# Patient Record
Sex: Female | Born: 1950 | Race: White | Hispanic: No | Smoking: Current every day smoker
Health system: Southern US, Community
[De-identification: ages and names within clinical notes are randomized; demographics above are authoritative.]

## PROBLEM LIST (undated history)

## (undated) DIAGNOSIS — J45909 Unspecified asthma, uncomplicated: Secondary | ICD-10-CM

## (undated) DIAGNOSIS — I10 Essential (primary) hypertension: Secondary | ICD-10-CM

## (undated) DIAGNOSIS — J449 Chronic obstructive pulmonary disease, unspecified: Secondary | ICD-10-CM

## (undated) DIAGNOSIS — F17200 Nicotine dependence, unspecified, uncomplicated: Secondary | ICD-10-CM

## (undated) DIAGNOSIS — IMO0001 Reserved for inherently not codable concepts without codable children: Secondary | ICD-10-CM

## (undated) DIAGNOSIS — F419 Anxiety disorder, unspecified: Secondary | ICD-10-CM

## (undated) DIAGNOSIS — D649 Anemia, unspecified: Secondary | ICD-10-CM

## (undated) DIAGNOSIS — E66813 Obesity, class 3: Secondary | ICD-10-CM

## (undated) DIAGNOSIS — R079 Chest pain, unspecified: Secondary | ICD-10-CM

## (undated) DIAGNOSIS — G4730 Sleep apnea, unspecified: Secondary | ICD-10-CM

## (undated) DIAGNOSIS — Z87442 Personal history of urinary calculi: Secondary | ICD-10-CM

## (undated) DIAGNOSIS — R7303 Prediabetes: Secondary | ICD-10-CM

## (undated) DIAGNOSIS — I219 Acute myocardial infarction, unspecified: Secondary | ICD-10-CM

## (undated) DIAGNOSIS — F32A Depression, unspecified: Secondary | ICD-10-CM

## (undated) DIAGNOSIS — F329 Major depressive disorder, single episode, unspecified: Secondary | ICD-10-CM

## (undated) DIAGNOSIS — M1990 Unspecified osteoarthritis, unspecified site: Secondary | ICD-10-CM

## (undated) HISTORY — PX: TONSILLECTOMY: SUR1361

## (undated) HISTORY — PX: CARPAL TUNNEL RELEASE: SHX101

## (undated) HISTORY — PX: BREAST LUMPECTOMY: SHX2

## (undated) HISTORY — PX: CERVICAL DISC SURGERY: SHX588

## (undated) HISTORY — PX: ABDOMINAL HYSTERECTOMY: SHX81

## (undated) HISTORY — PX: HEMORROIDECTOMY: SUR656

---

## 1999-09-28 ENCOUNTER — Emergency Department (HOSPITAL_COMMUNITY): Admission: EM | Admit: 1999-09-28 | Discharge: 1999-09-28 | Payer: Self-pay | Admitting: *Deleted

## 1999-09-28 ENCOUNTER — Encounter: Payer: Self-pay | Admitting: Emergency Medicine

## 2000-03-18 ENCOUNTER — Ambulatory Visit (HOSPITAL_COMMUNITY): Admission: RE | Admit: 2000-03-18 | Discharge: 2000-03-18 | Payer: Self-pay | Admitting: Family Medicine

## 2002-05-15 ENCOUNTER — Encounter: Payer: Self-pay | Admitting: Internal Medicine

## 2002-05-15 ENCOUNTER — Inpatient Hospital Stay (HOSPITAL_COMMUNITY): Admission: AD | Admit: 2002-05-15 | Discharge: 2002-05-16 | Payer: Self-pay | Admitting: Internal Medicine

## 2002-07-13 ENCOUNTER — Ambulatory Visit (HOSPITAL_COMMUNITY): Admission: RE | Admit: 2002-07-13 | Discharge: 2002-07-13 | Payer: Self-pay | Admitting: Family Medicine

## 2002-07-28 ENCOUNTER — Encounter: Payer: Self-pay | Admitting: Neurology

## 2002-07-28 ENCOUNTER — Ambulatory Visit (HOSPITAL_COMMUNITY): Admission: RE | Admit: 2002-07-28 | Discharge: 2002-07-28 | Payer: Self-pay | Admitting: Neurology

## 2005-04-01 ENCOUNTER — Ambulatory Visit (HOSPITAL_COMMUNITY): Admission: AD | Admit: 2005-04-01 | Discharge: 2005-04-02 | Payer: Self-pay | Admitting: Neurosurgery

## 2005-12-07 ENCOUNTER — Encounter (INDEPENDENT_AMBULATORY_CARE_PROVIDER_SITE_OTHER): Payer: Self-pay | Admitting: Specialist

## 2005-12-07 ENCOUNTER — Encounter: Admission: RE | Admit: 2005-12-07 | Discharge: 2005-12-07 | Payer: Self-pay | Admitting: General Surgery

## 2006-01-04 ENCOUNTER — Encounter: Admission: RE | Admit: 2006-01-04 | Discharge: 2006-01-04 | Payer: Self-pay | Admitting: General Surgery

## 2006-01-06 ENCOUNTER — Ambulatory Visit (HOSPITAL_BASED_OUTPATIENT_CLINIC_OR_DEPARTMENT_OTHER): Admission: RE | Admit: 2006-01-06 | Discharge: 2006-01-06 | Payer: Self-pay | Admitting: General Surgery

## 2006-01-06 ENCOUNTER — Encounter: Admission: RE | Admit: 2006-01-06 | Discharge: 2006-01-06 | Payer: Self-pay | Admitting: General Surgery

## 2006-01-06 ENCOUNTER — Encounter (INDEPENDENT_AMBULATORY_CARE_PROVIDER_SITE_OTHER): Payer: Self-pay | Admitting: *Deleted

## 2009-06-19 ENCOUNTER — Ambulatory Visit (HOSPITAL_BASED_OUTPATIENT_CLINIC_OR_DEPARTMENT_OTHER): Admission: RE | Admit: 2009-06-19 | Discharge: 2009-06-19 | Payer: Self-pay | Admitting: Orthopedic Surgery

## 2009-10-12 ENCOUNTER — Inpatient Hospital Stay: Payer: Self-pay | Admitting: Internal Medicine

## 2009-10-12 ENCOUNTER — Ambulatory Visit: Payer: Self-pay | Admitting: Oncology

## 2009-11-04 ENCOUNTER — Ambulatory Visit: Payer: Self-pay | Admitting: Oncology

## 2009-11-12 ENCOUNTER — Ambulatory Visit: Payer: Self-pay | Admitting: Oncology

## 2010-08-29 LAB — BASIC METABOLIC PANEL
BUN: 14 mg/dL (ref 6–23)
Creatinine, Ser: 0.86 mg/dL (ref 0.4–1.2)
GFR calc non Af Amer: >60 mL/min (ref >60)
Potassium: 4.4 mEq/L (ref 3.5–5.1)

## 2010-08-29 LAB — POCT HEMOGLOBIN-HEMACUE: Hemoglobin: 15.5 g/dL — ABNORMAL HIGH (ref 12.0–15.0)

## 2010-10-30 NOTE — Discharge Summary (Signed)
NAME:  Jenna [name], Jenna [name]                          ACCOUNT NO.:  [id number]   MEDICAL RECORD NO.:  [id number]                   PATIENT TYPE:  INP   LOCATION:  5502                                 FACILITY:  [facility]   PHYSICIAN:  Sherin Quarry, MD                   DATE OF BIRTH:  [birth year]   DATE OF ADMISSION:  05/15/2002  DATE OF DISCHARGE:  05/16/2002                                 DISCHARGE SUMMARY   The patient is a 60 year old lady who reports a three-week history of  numbness of both hands and both feet.  There was no associated weakness or  pain.  On Sunday, she noted the onset of a cold feeling in the left arm  which seemed to run from the shoulder to the elbow followed by numbness in  left arm and left leg.  There was no associated visual blurring, motor  weakness, or dysphagia.  The episode seemed to last an hour and then  resolved.  On Monday, she noted another episode and then two more episodes  later in the day.  Each of these lasted about 15 or 20 minutes.  She was,  therefore, advised to come to the hospital for further evaluation.  Her past  history is remarkable for hypertension, hyperlipidemia, and COPD with a 50-  pack-year smoking history.   PHYSICAL EXAMINATION:  HEENT:  At the time of admission, examination was  within normal limits.  NECK:  Carotids were 2+.  CHEST:  Clear.  BACK:  No CVA or point tenderness.  CARDIOVASCULAR:  Normal S1 and S2 without murmurs, rubs, or gallops.  ABDOMEN:  Benign.  NEUROLOGIC:  Cranial nerves, motor, sensory, and cerebellar testing were  normal.  EXTREMITIES:  No evidence of cyanosis or edema.   INITIAL IMPRESSION:  Possible transient ischemic attack.   LABORATORY DATA:  Subsequently the patient underwent laboratory testing  which included a CBC, TSH, lipid profile, and CMP, all of which were normal.   Carotid studies were done.  These showed no internal carotid stenosis.  Vertebral artery flow was antegrade.   MRI scan  was done.  I reviewed the results of this test with the  radiologist.  I do not have the final report.  This study showed no stroke,  no brain lesions, and no apparent circulatory impairment.  I was advised by  utilization review that the patient did not meet criteria for acute  admission. Therefore, we decided to arrange further evaluation as an  outpatient.   On December 3, the patient was discharged.   DISCHARGE DIAGNOSES:  1. Left upper extremity numbness of uncertain etiology.  2. Hypertension.  3. Hyperlipidemia.  4. Chronic obstructive pulmonary disease.  5. Degenerative joint disease.   DISCHARGE MEDICATIONS:  The patient was advised to continue her usual  medicines and also was advised to take aspirin 325 mg daily.   SPECIAL INSTRUCTIONS:  She was very strongly on several occasions advised to  discontinue cigarette smoking.  It should be noted that during her  hospitalization, she became very upset about not being allowed to smoke.  I  asked the floor to schedule a return appointment with Dr. Adonis Housekeeper at  [facility] and also a neurology appointment with [facility] as an outpatient.   CONDITION ON DISCHARGE:  Good.                                               Sherin Quarry, MD    SY/MEDQ  D:  05/16/2002  T:  05/16/2002  Job:  [id number]   cc:   Duwayne Heck L. Mahaffey, M.D.  [address]  [place]  [place] [address]  Fax: [phone]

## 2010-10-30 NOTE — Op Note (Signed)
Jenna [name], Jenna [name]                ACCOUNT NO.:  [id number]   MEDICAL RECORD NO.:  [id number]          PATIENT TYPE:  INP   LOCATION:  NA                           FACILITY:  [facility]   PHYSICIAN:  Reinaldo Meeker, M.D. DATE OF BIRTH:  [birth year]   DATE OF PROCEDURE:  04/01/2005  DATE OF DISCHARGE:                                 OPERATIVE REPORT   PREOPERATIVE DIAGNOSIS:  Herniated disk, C5-6 left.   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   OPERATION PERFORMED:  C5-6 anterior cervical diskectomy with bone bank  fusion followed by Zephir anterior cervical plating.   SURGEON:  Reinaldo Meeker, M.D.   ASSISTANT:  Tia Alert, MD   ANESTHESIA:  General.   DESCRIPTION OF PROCEDURE:  After being placed in supine position in five  pounds halter traction, the patient's neck was prepped and draped in the  usual sterile fashion.  Localizing x-ray was taken prior to incision to  identify the appropriate level. A transverse incision was made in the right  anterior neck starting at the midline, heading towards the medial aspect of  the sternocleidomastoid muscle.  The platysma muscle was then incised  transversely.  The natural fascial planes between the strap muscles medially  and sternocleidomastoid muscle laterally was identified and followed down to  the anterior aspect of the cervical spine.  The longus colli muscles were  identified and split in the midline, stripped away bilaterally with Chief Operating Officer.  A second x-ray showed approach to the C5-6  level though this was rather difficult to interpret due to the patient's  large body habitus, but numerous attempts did appear to show consistently  that we were at the C5-6 level.  The annulus at the disk at C5-6 was  incised.  Using pituitary rongeurs and curettes, approximately 90% of the  disk material was removed.  A high speed drill was used to widen the  interspace.  The microscope was draped and  brought into the field and used  for the remainder of the case.  Using microdissection technique, the  remainder of the disk material down to the posterior longitudinal ligament  was removed.  The ligament was incised transversely and the cut ends removed  with a Kerrison punch.  Foraminal decompression was carried out bilaterally,  particularly out the left, symptomatic side.  At this time, inspection was  carried out in all directions for any evidence of residual compression and  none could be identified.  Large amounts of irrigation were carried out.  Measurements were taken and a 6 mm bone bank plug was reconstituted.  After  irrigating once more and confirming hemostasis, a plug was impacted without  difficulty.  Fluoroscopy showed the plug to be in good position.  Appropriate length Zephyr anterior cervical plate was then chosen.  Dry  holes were placed followed by placement of 13 mm screws times four.  Locking  mechanisms were then secured and final fluoroscopy attempted to show the  plate and it appeared to be in good position.  At this time large  amounts of  irrigation were carried out and any bleeding controlled with bipolar  coagulation.  The wound was then closed using interrupted Vicryl in the  platysma muscle, inverted 5-0 PDS in the subcuticular layer and Steri-Strips  on the skin.  A sterile dressing and soft collar were applied and the  patient was extubated and taken to the recovery room in stable condition.           ______________________________  Reinaldo Meeker, M.D.     ROK/MEDQ  D:  04/01/2005  T:  04/02/2005  Job:  [id number]

## 2010-10-30 NOTE — Op Note (Signed)
NAMEALDEN, Jenna [name]                ACCOUNT NO.:  [id number]   MEDICAL RECORD NO.:  [id number]          PATIENT TYPE:  AMB   LOCATION:  DSC                          FACILITY:  [facility]   PHYSICIAN:  Leonie Man, M.D.   DATE OF BIRTH:  Mar 13, 1951   DATE OF PROCEDURE:  01/06/2006  DATE OF DISCHARGE:                                 OPERATIVE REPORT   PREOPERATIVE DIAGNOSIS:  Left breast lesion, rule out carcinoma.   POSTOPERATIVE DIAGNOSIS:  Left breast lesion, rule out carcinoma, pathology  pending.   PROCEDURE:  Lumpectomy, left breast.   SURGEON:  Leonie Man, M.D.   ASSISTANT:  OR nurse.   ANESTHESIA:  Is general.   NOTE:  Jenna [name] is a 59 year old female presenting with an abnormal  mammogram read as a BI-RADS IV and showing an area of calcifications in the  left breast.  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  Because of persistence of the calcifications, I  told total the patient that it would probably be best for her to have this  lesion removed surgically and she accepts this and comes to the operating  room now for a lumpectomy of the left breast.   PROCEDURE:  Following the induction of satisfactory general anesthesia, the  patient is positioned supinely and the left breast was prepped and draped to  be included in the sterile operative field.  Prior to coming to the  operating room, the patient underwent needle localization of the lesion,  which is located at the 12 o'clock axis of the left breast.  Following  induction of satisfactory general anesthesia, the left breast was prepped  and draped to be included in the sterile operative field.  I made an  elliptical incision around the localizing needle, deepening this through  skin and subcutaneous tissues, carrying the dissection widely around in the  localizing needle and carrying it down to the  pectoralis major muscle.  The  breast tissue was dissected free from off of the pectoralis major muscle and  removed and forwarded for pathologic evaluation after a long suture was  placed at the 12 o'clock axis and a double suture placed at the 3 o'clock  axis.  Hemostasis was obtained with electrocautery.  Sponge and instrument  counts were verified.  Subcutaneous tissues were then closed with  interrupted 2-0 Vicryl sutures and the skin was closed with a running five 5-  0 Monocryl suture and then reinforced with Steri-Strips.  I infiltrated the  breast and surrounding tissues with 0.5% Marcaine with epinephrine.  Sterile  compressive dressings were applied, the anesthetic reversed and the patient  removed from the operating room to the recovery room in stable condition.  She tolerated the procedure well.     Leonie Man, M.D.  Electronically Signed    PB/MEDQ  D:  01/06/2006  T:  01/06/2006  Job:  [id number]

## 2017-07-15 HISTORY — PX: COLONOSCOPY: SHX174

## 2017-09-12 DIAGNOSIS — R079 Chest pain, unspecified: Secondary | ICD-10-CM

## 2017-09-12 HISTORY — DX: Chest pain, unspecified: R07.9

## 2017-09-26 ENCOUNTER — Encounter (HOSPITAL_COMMUNITY): Payer: Self-pay | Admitting: Internal Medicine

## 2017-09-26 ENCOUNTER — Other Ambulatory Visit: Payer: Self-pay

## 2017-09-26 ENCOUNTER — Emergency Department (HOSPITAL_COMMUNITY): Payer: Medicare Other

## 2017-09-26 ENCOUNTER — Inpatient Hospital Stay (HOSPITAL_COMMUNITY)
Admission: EM | Admit: 2017-09-26 | Discharge: 2017-09-28 | DRG: 281 | Disposition: A | Discharge status: Home / Self Care | Payer: Medicare Other | Attending: Internal Medicine | Admitting: Internal Medicine

## 2017-09-26 DIAGNOSIS — I200 Unstable angina: Secondary | ICD-10-CM

## 2017-09-26 DIAGNOSIS — I25110 Atherosclerotic heart disease of native coronary artery with unstable angina pectoris: Secondary | ICD-10-CM | POA: Diagnosis present

## 2017-09-26 DIAGNOSIS — E785 Hyperlipidemia, unspecified: Secondary | ICD-10-CM | POA: Diagnosis present

## 2017-09-26 DIAGNOSIS — I10 Essential (primary) hypertension: Secondary | ICD-10-CM | POA: Diagnosis present

## 2017-09-26 DIAGNOSIS — R079 Chest pain, unspecified: Secondary | ICD-10-CM | POA: Diagnosis not present

## 2017-09-26 DIAGNOSIS — Z23 Encounter for immunization: Secondary | ICD-10-CM | POA: Diagnosis present

## 2017-09-26 DIAGNOSIS — I214 Non-ST elevation (NSTEMI) myocardial infarction: Principal | ICD-10-CM | POA: Diagnosis present

## 2017-09-26 DIAGNOSIS — Z6841 Body Mass Index (BMI) 40.0 and over, adult: Secondary | ICD-10-CM

## 2017-09-26 DIAGNOSIS — F17210 Nicotine dependence, cigarettes, uncomplicated: Secondary | ICD-10-CM | POA: Diagnosis present

## 2017-09-26 DIAGNOSIS — N3281 Overactive bladder: Secondary | ICD-10-CM | POA: Diagnosis present

## 2017-09-26 DIAGNOSIS — E782 Mixed hyperlipidemia: Secondary | ICD-10-CM | POA: Diagnosis not present

## 2017-09-26 DIAGNOSIS — Z8249 Family history of ischemic heart disease and other diseases of the circulatory system: Secondary | ICD-10-CM

## 2017-09-26 DIAGNOSIS — F17200 Nicotine dependence, unspecified, uncomplicated: Secondary | ICD-10-CM | POA: Diagnosis not present

## 2017-09-26 HISTORY — DX: Obesity, class 3: E66.813

## 2017-09-26 HISTORY — DX: Anxiety disorder, unspecified: F41.9

## 2017-09-26 HISTORY — DX: Major depressive disorder, single episode, unspecified: F32.9

## 2017-09-26 HISTORY — DX: Unspecified osteoarthritis, unspecified site: M19.90

## 2017-09-26 HISTORY — DX: Reserved for inherently not codable concepts without codable children: IMO0001

## 2017-09-26 HISTORY — DX: Depression, unspecified: F32.A

## 2017-09-26 HISTORY — DX: Morbid (severe) obesity due to excess calories: E66.01

## 2017-09-26 HISTORY — DX: Essential (primary) hypertension: I10

## 2017-09-26 HISTORY — DX: Nicotine dependence, unspecified, uncomplicated: F17.200

## 2017-09-26 HISTORY — DX: Chest pain, unspecified: R07.9

## 2017-09-26 HISTORY — DX: Chronic obstructive pulmonary disease, unspecified: J44.9

## 2017-09-26 HISTORY — DX: Sleep apnea, unspecified: G47.30

## 2017-09-26 LAB — CBC WITH DIFFERENTIAL/PLATELET
Basophils Absolute: 0 10*3/uL (ref 0.0–0.1)
Basophils Relative: 0 %
Eosinophils Absolute: 0.2 10*3/uL (ref 0.0–0.7)
Eosinophils Relative: 2 %
HCT: 44.9 % (ref 36.0–46.0)
HEMOGLOBIN: 15.9 g/dL — AB (ref 12.0–15.0)
LYMPHS ABS: 3.1 10*3/uL (ref 0.7–4.0)
LYMPHS PCT: 28 %
MCH: 31.5 pg (ref 26.0–34.0)
MCHC: 35.4 g/dL (ref 30.0–36.0)
MCV: 88.9 fL (ref 78.0–100.0)
MONOS PCT: 9 %
Monocytes Absolute: 1 10*3/uL (ref 0.1–1.0)
NEUTROS PCT: 61 %
Neutro Abs: 6.9 10*3/uL (ref 1.7–7.7)
Platelets: 277 10*3/uL (ref 150–400)
RBC: 5.05 MIL/uL (ref 3.87–5.11)
RDW: 12.9 % (ref 11.5–15.5)
WBC: 11.3 10*3/uL — AB (ref 4.0–10.5)

## 2017-09-26 LAB — BASIC METABOLIC PANEL
Anion gap: 12 (ref 5–15)
BUN: 16 mg/dL (ref 6–20)
CHLORIDE: 103 mmol/L (ref 101–111)
CO2: 20 mmol/L — AB (ref 22–32)
Calcium: 8.8 mg/dL — ABNORMAL LOW (ref 8.9–10.3)
Creatinine, Ser: 0.97 mg/dL (ref 0.44–1.00)
GFR calc Af Amer: >60 mL/min (ref >60)
GFR calc non Af Amer: 60 mL/min — ABNORMAL LOW (ref >60)
Glucose, Bld: 99 mg/dL (ref 65–99)
POTASSIUM: 3.9 mmol/L (ref 3.5–5.1)
SODIUM: 135 mmol/L (ref 135–145)

## 2017-09-26 LAB — I-STAT TROPONIN, ED: Troponin i, poc: 0.13 ng/mL (ref 0.00–0.08)

## 2017-09-26 LAB — TROPONIN I: TROPONIN I: 2.54 ng/mL — AB (ref <0.03)

## 2017-09-26 MED ORDER — NITROGLYCERIN 0.4 MG SL SUBL: 0.4000 mg | SUBLINGUAL_TABLET | SUBLINGUAL | Status: DC | PRN

## 2017-09-26 MED ORDER — ASPIRIN 81 MG PO CHEW: 81.0000 mg | CHEWABLE_TABLET | Freq: Once | ORAL | Status: DC

## 2017-09-26 MED ORDER — ASPIRIN 81 MG PO CHEW
324.0000 mg | CHEWABLE_TABLET | Freq: Once | ORAL | Status: AC
  Administered 2017-09-26: 324 mg via ORAL
  Filled 2017-09-26: qty 4

## 2017-09-26 MED ORDER — ALBUTEROL SULFATE HFA 108 (90 BASE) MCG/ACT IN AERS: 1.0000 | INHALATION_SPRAY | Freq: Four times a day (QID) | RESPIRATORY_TRACT | Status: DC | PRN

## 2017-09-26 MED ORDER — ATORVASTATIN CALCIUM 40 MG PO TABS
40.0000 mg | ORAL_TABLET | Freq: Every day | ORAL | Status: DC
  Filled 2017-09-26 (×2): qty 1

## 2017-09-26 MED ORDER — ACETAMINOPHEN 325 MG PO TABS: 650.0000 mg | ORAL_TABLET | ORAL | Status: DC | PRN

## 2017-09-26 MED ORDER — METOPROLOL TARTRATE 25 MG PO TABS
50.0000 mg | ORAL_TABLET | Freq: Two times a day (BID) | ORAL | Status: DC
  Administered 2017-09-26 – 2017-09-28 (×4): 50 mg via ORAL
  Filled 2017-09-26 (×4): qty 2

## 2017-09-26 MED ORDER — MORPHINE SULFATE (PF) 4 MG/ML IV SOLN: 4.0000 mg | Freq: Once | INTRAVENOUS | Status: DC

## 2017-09-26 MED ORDER — ASPIRIN EC 81 MG PO TBEC
81.0000 mg | DELAYED_RELEASE_TABLET | Freq: Every day | ORAL | Status: DC
  Administered 2017-09-27: 81 mg via ORAL
  Filled 2017-09-26: qty 1

## 2017-09-26 MED ORDER — PAROXETINE HCL 20 MG PO TABS
40.0000 mg | ORAL_TABLET | Freq: Every day | ORAL | Status: DC
  Administered 2017-09-27 – 2017-09-28 (×2): 40 mg via ORAL
  Filled 2017-09-26 (×3): qty 2

## 2017-09-26 MED ORDER — NICOTINE 21 MG/24HR TD PT24
21.0000 mg | MEDICATED_PATCH | Freq: Every day | TRANSDERMAL | Status: DC
  Administered 2017-09-26 – 2017-09-28 (×3): 21 mg via TRANSDERMAL
  Filled 2017-09-26 (×3): qty 1

## 2017-09-26 MED ORDER — OXYBUTYNIN CHLORIDE 5 MG PO TABS
5.0000 mg | ORAL_TABLET | Freq: Every day | ORAL | Status: DC
  Administered 2017-09-28: 5 mg via ORAL
  Filled 2017-09-26 (×2): qty 1

## 2017-09-26 MED ORDER — ALBUTEROL SULFATE (2.5 MG/3ML) 0.083% IN NEBU: 2.5000 mg | INHALATION_SOLUTION | Freq: Four times a day (QID) | RESPIRATORY_TRACT | Status: DC | PRN

## 2017-09-26 MED ORDER — HEPARIN SODIUM (PORCINE) 5000 UNIT/ML IJ SOLN
4000.0000 [IU] | Freq: Once | INTRAMUSCULAR | Status: DC
  Filled 2017-09-26: qty 1

## 2017-09-26 MED ORDER — HEPARIN BOLUS VIA INFUSION
3500.0000 [IU] | Freq: Once | INTRAVENOUS | Status: AC
  Administered 2017-09-26: 3500 [IU] via INTRAVENOUS
  Filled 2017-09-26: qty 3500

## 2017-09-26 MED ORDER — HEPARIN (PORCINE) IN NACL 100-0.45 UNIT/ML-% IJ SOLN
1150.0000 [IU]/h | INTRAMUSCULAR | Status: DC
  Administered 2017-09-26: 800 [IU]/h via INTRAVENOUS
  Filled 2017-09-26 (×2): qty 250

## 2017-09-26 MED ORDER — ONDANSETRON HCL 4 MG/2ML IJ SOLN: 4.0000 mg | Freq: Four times a day (QID) | INTRAMUSCULAR | Status: DC | PRN

## 2017-09-26 NOTE — ED Provider Notes (Signed)
[facility] EMERGENCY DEPARTMENT Provider Note   CSN: [id number] Arrival date & time: 09/26/17  1743     History   Chief Complaint Chief Complaint  Patient presents with  . Chest Pain    HPI Wynelle LinkCarol A Wormley is a 67 y.o. female with PMH/o HTN, BIB EMS for evaluation of left-sided chest pain that began approximately 410 this afternoon.  Patient reports he was at her desk at work when she started experiencing left-sided chest pain that radiated up radiated up into the left side of her neck.  Patient reports that she did get nauseous and had one episode of vomiting with pain.  Patient denies any diaphoresis, difficulty breathing.  On ED arrival, she states that pain has improved but states she is still having a dull left-sided pain.  Denies any associated shortness of breath.  Patient reports she does not have any personal cardiac history but states that her father had a heart attack in his 4940s.  She also reports that her son has cardiovascular disease and had a heart attack when he was young.  Patient reports she does smoke.  She reports that she has smoked a pack a day for the last 53 years.  Patient denies any recent fevers, difficulty breathing, abdominal pain, leg swelling.  The history is provided by the patient.    Past Medical History:  Diagnosis Date  . HTN (hypertension)   . Obesity, Class III, BMI 40-49.9 (morbid obesity) (HCC)   . Smoking     Patient Active Problem List   Diagnosis Date Noted  . Unstable angina (HCC) 09/26/2017  . HTN (hypertension) 09/26/2017    The histories are not reviewed yet. Please review them in the "History" navigator section and refresh this SmartLink.   OB History   None      Home Medications    Prior to Admission medications   Medication Sig Start Date End Date Taking? Authorizing Provider  albuterol (PROVENTIL HFA;VENTOLIN HFA) 108 (90 Base) MCG/ACT inhaler Inhale 1-2 puffs into the lungs every 6 (six) hours as  needed for wheezing or shortness of breath.   Yes [provider]  albuterol (PROVENTIL) (2.5 MG/3ML) 0.083% nebulizer solution Take 2.5 mg by nebulization every 6 (six) hours as needed for wheezing or shortness of breath.   Yes [provider]  cetirizine (ZYRTEC) 10 MG tablet Take 10 mg by mouth every morning.   Yes [provider]  metoprolol tartrate (LOPRESSOR) 50 MG tablet Take 50 mg by mouth 2 (two) times daily.   Yes [provider]  naproxen sodium (ALEVE) 220 MG tablet Take 440 mg by mouth every morning.   Yes [provider]  oxybutynin (DITROPAN) 5 MG tablet Take 5 mg by mouth every morning.   Yes [provider]  PARoxetine (PAXIL) 20 MG tablet Take 40 mg by mouth daily.   Yes [provider]  phentermine 15 MG capsule Take 15 mg by mouth every morning.   Yes [provider]  spironolactone (ALDACTONE) 50 MG tablet Take 50 mg by mouth daily.   Yes [provider]    Family History Family History  Problem Relation Age of Onset  . Heart attack Father 4140  . Heart attack Sister 4750       s/p PPM after a "major heart attack"  . CAD Son 2541    Social History Social History   Tobacco Use  . Smoking status: Current Every Day Smoker  Packs/day: 1.00  . Tobacco comment: 1PPD since age 50  Substance Use Topics  . Alcohol use: Not Currently  . Drug use: Not Currently     Allergies   Mustard [allyl isothiocyanate] and Hctz [hydrochlorothiazide]   Review of Systems Review of Systems  Constitutional: Negative for chills and fever.  Eyes: Negative for visual disturbance.  Respiratory: Negative for cough and shortness of breath.   Cardiovascular: Positive for chest pain.  Gastrointestinal: Positive for nausea and vomiting. Negative for abdominal pain and diarrhea.  Genitourinary: Negative for dysuria and hematuria.  Musculoskeletal: Negative for back pain and neck pain.  Skin: Negative for rash.   Neurological: Positive for light-headedness. Negative for dizziness, weakness, numbness and headaches.  Psychiatric/Behavioral: Negative for confusion.     Physical Exam Updated Vital Signs BP (!) 162/78   Pulse 81   Temp 98.1 F (36.7 C) (Oral)   Resp (!) 25   Ht 4\' 11"  (1.499 m)   Wt 90.7 kg (200 lb)   LMP  (LMP Unknown)   SpO2 97%   BMI 40.40 kg/m   Physical Exam  Constitutional: She is oriented to person, place, and time. She appears well-developed and well-nourished.  HENT:  Head: Normocephalic and atraumatic.  Mouth/Throat: Oropharynx is clear and moist and mucous membranes are normal.  Eyes: Pupils are equal, round, and reactive to light. Conjunctivae, EOM and lids are normal.  Neck: Full passive range of motion without pain.  Cardiovascular: Normal rate, regular rhythm, normal heart sounds and normal pulses. Exam reveals no gallop and no friction rub.  No murmur heard. Pulmonary/Chest: Effort normal and breath sounds normal.  No evidence of respiratory distress. Able to speak in full sentences without difficulty.  Abdominal: Soft. Normal appearance. There is no tenderness. There is no rigidity and no guarding.  Musculoskeletal: Normal range of motion.  BLE are symmetric in appearance  Neurological: She is alert and oriented to person, place, and time.  Cranial nerves III-XII intact Follows commands, Moves all extremities  5/5 strength to BUE and BLE  Sensation intact throughout all major nerve distributions Normal finger to nose. No dysdiadochokinesia. No pronator drift. No gait abnormalities  No slurred speech. No facial droop.  Skin: Skin is warm and dry. Capillary refill takes less than 2 seconds.  Psychiatric: She has a normal mood and affect. Her speech is normal.  Nursing note and vitals reviewed.    ED Treatments / Results  Labs (all labs ordered are listed, but only abnormal results are displayed) Labs Reviewed  BASIC METABOLIC PANEL - Abnormal;  Notable for the following components:      Result Value   CO2 20 (*)    Calcium 8.8 (*)    GFR calc non Af Amer 60 (*)    All other components within normal limits  CBC WITH DIFFERENTIAL/PLATELET - Abnormal; Notable for the following components:   WBC 11.3 (*)    Hemoglobin 15.9 (*)    All other components within normal limits  TROPONIN I - Abnormal; Notable for the following components:   Troponin I 2.54 (*)    All other components within normal limits  I-STAT TROPONIN, ED - Abnormal; Notable for the following components:   Troponin i, poc 0.13 (*)    All other components within normal limits  HEPARIN LEVEL (UNFRACTIONATED)  CBC  TROPONIN I  TROPONIN I  LIPID PANEL    EKG EKG Interpretation  Date/Time:  Monday September 26 2017 17:57:17 EDT Ventricular Rate:  83 PR  Interval:  150 QRS Duration: 68 QT Interval:  380 QTC Calculation: 446 R Axis:   48 Text Interpretation:  Sinus rhythm with occasional Premature ventricular complexes Otherwise normal ECG no acute ischemic changes Confirmed by Arby Barrette [phone]) on 09/26/2017 10:36:34 PM   Radiology Dg Chest 2 View  Result Date: 09/26/2017 CLINICAL DATA:  Chest pain EXAM: CHEST - 2 VIEW COMPARISON:  10/12/2009 FINDINGS: Subsegmental atelectasis or scar at the left base. No acute consolidation or pleural effusion. Mild cardiomegaly. Aortic atherosclerosis. No pneumothorax. Surgical clips in the cervical spine. Mild degenerative changes. IMPRESSION: 1. Atelectasis or scar at the left base 2. Mild cardiomegaly without edema or focal infiltrate Electronically Signed   By: Jasmine Pang M.D.   On: 09/26/2017 18:57    Procedures .Critical Care Performed by: Maxwell Caul, PA-C Authorized by: Maxwell Caul, PA-C   Critical care provider statement:    Critical care time (minutes):  35   Critical care time was exclusive of:  Separately billable procedures and treating other patients   Critical care was necessary to treat  or prevent imminent or life-threatening deterioration of the following conditions:  Cardiac failure, renal failure, circulatory failure and respiratory failure   Critical care was time spent personally by me on the following activities:  Blood draw for specimens, ordering and performing treatments and interventions, ordering and review of laboratory studies, development of treatment plan with patient or surrogate, discussions with consultants, ordering and review of radiographic studies, pulse oximetry, re-evaluation of patient's condition and evaluation of patient's response to treatment   (including critical care time)  Medications Ordered in ED Medications  morphine 4 MG/ML injection 4 mg (4 mg Intravenous Not Given 09/26/17 2120)  heparin ADULT infusion 100 units/mL (25000 units/247mL sodium chloride 0.45%) (800 Units/hr Intravenous New Bag/Given 09/26/17 2201)  aspirin EC tablet 81 mg (has no administration in time range)  nitroGLYCERIN (NITROSTAT) SL tablet 0.4 mg (has no administration in time range)  acetaminophen (TYLENOL) tablet 650 mg (has no administration in time range)  ondansetron (ZOFRAN) injection 4 mg (has no administration in time range)  metoprolol tartrate (LOPRESSOR) tablet 50 mg (50 mg Oral Given 09/26/17 2206)  atorvastatin (LIPITOR) tablet 40 mg (has no administration in time range)  nicotine (NICODERM CQ - dosed in mg/24 hours) patch 21 mg (21 mg Transdermal Patch Applied 09/26/17 2206)  oxybutynin (DITROPAN) tablet 5 mg (has no administration in time range)  PARoxetine (PAXIL) tablet 40 mg (has no administration in time range)  albuterol (PROVENTIL) (2.5 MG/3ML) 0.083% nebulizer solution 2.5 mg (has no administration in time range)  heparin bolus via infusion 3,500 Units (3,500 Units Intravenous Bolus from Bag 09/26/17 2210)  aspirin chewable tablet 324 mg (324 mg Oral Given 09/26/17 2206)     Initial Impression / Assessment and Plan / ED Course  I have reviewed the  triage vital signs and the nursing notes.  Pertinent labs & imaging results that were available during my care of the patient were reviewed by me and considered in my medical decision making (see chart for details).     67 year old female who presents for evaluation of left-sided chest pain that began approximate 4:10 PM this afternoon.  Associated with nausea, vomiting.  On ED arrival states that pain is dull 5/10. Associated with nausea/vomiting. No SOB. Patient is afebrile, non-toxic appearing, sitting comfortably on examination table. Vital signs reviewed and stable.  Consider infectious etiology versus ACS etiology versus musculoskeletal pain.  Plan to check basic labs,  EKG, chest x-ray.  Chest x-ray negative for any acute abnormality.  BMP shows bicarb of 20.  CBC with leukocytosis of 11.3.  Hemoglobin is 15.9.  Troponin is 0.13.  Given concerns for elevated troponin, concern for NSTEMI.  Patient started on heparin.   Given patient's history, risk factors, presentation, she has a heart score of 7.  Given heart score and elevated troponin, will likely need admission.  8:27 PM: Paged cardiology.  8:50 PM: Plan for admission. Will page cardiology and hospitalist.   Discussed with Dr. Julian Reil (hospitalist). Will plan for admission. Recommends re-paging cardiology.   9:10 PM: Consult to cardiology cath placed     9:44 PM: Paged Cardiology.  10:18 PM: Consult to Cardiology    Final Clinical Impressions(s) / ED Diagnoses   Final diagnoses:  NSTEMI (non-ST elevated myocardial infarction) [facility])    ED Discharge Orders    None       Rosana Hoes 09/27/17 0030    Arby Barrette, MD 10/01/17 [id number]    Arby Barrette, MD 10/01/17 [phone]

## 2017-09-26 NOTE — ED Notes (Signed)
Patient transported to X-ray 

## 2017-09-26 NOTE — H&P (Signed)
History and Physical    Jenna Jenna [name] Rosier UJW:[id number] DOB: 11-Mar-1951 DOA: 09/26/2017  PCP: No primary care provider on file.  Patient coming from: Home  I have personally briefly reviewed patient's old medical [facility]  Chief Complaint: CP  HPI: Jenna Jenna [name] Eddie is Jenna [name] 67 y.o. female with medical history significant of HTN, obesity, smoking, extensive FHx of CAD and MIs.  Patient presents to the ED via EMS for onset of L sided CP.  Onset 410 this afternoon.  Was at desk at work when she began having L sided CP that radiated to L neck.  Associated nausea, 1 episode of vomiting.  No diaphoresis or SOB.  CP improved on arrival to ED.   ED Course: CP resolved at time of admit.  Initial EKG is unremarkable however Initial troponin is positive at 0.13!   Review of Systems: As per HPI otherwise 10 point review of systems negative.   Past Medical History:  Diagnosis Date  . HTN (hypertension)   . Obesity, Class III, BMI 40-49.9 (morbid obesity) (HCC)   . Smoking     History reviewed. No pertinent surgical history.   reports that she has been smoking.  She has been smoking about 1.00 pack per day. She does not have any smokeless tobacco history on file. She reports that she drank alcohol. She reports that she has current or past drug history.  Allergies  Allergen Reactions  . Arlice ColtMustard [Allyl Isothiocyanate] Anaphylaxis  . Hctz [Hydrochlorothiazide] Hives    "swells, turns red, feels like it's burning"    Family History  Problem Relation Age of Onset  . Heart attack Father 7240  . Heart attack Sister 6350       s/p PPM after Jenna [name] "major heart attack"  . CAD Son 8141     Prior to Admission medications   Not on File    Physical Exam: Vitals:   09/26/17 2030 09/26/17 2045 09/26/17 2054 09/26/17 2116  BP: (!) 151/97 (!) 148/76  109/76  Pulse: 88 83  79  Resp: (!) 27 (!) 29  (!) 22  Temp:      TempSrc:      SpO2: 97% 96%  96%  Weight:   90.7 kg (200 lb)   Height:    4\' 11"  (1.499 m)     Constitutional: NAD, calm, comfortable Eyes: PERRL, lids and conjunctivae normal ENMT: Mucous membranes are moist. Posterior pharynx clear of any exudate or lesions.Normal dentition.  Neck: normal, supple, no masses, no thyromegaly Respiratory: clear to auscultation bilaterally, no wheezing, no crackles. Normal respiratory effort. No accessory muscle use.  Cardiovascular: Regular rate and rhythm, no murmurs / rubs / gallops. No extremity edema. 2+ pedal pulses. No carotid bruits.  Abdomen: no tenderness, no masses palpated. No hepatosplenomegaly. Bowel sounds positive.  Musculoskeletal: no clubbing / cyanosis. No joint deformity upper and lower extremities. Good ROM, no contractures. Normal muscle tone.  Skin: no rashes, lesions, ulcers. No induration Neurologic: CN 2-12 grossly intact. Sensation intact, DTR normal. Strength 5/5 in all 4.  Psychiatric: Normal judgment and insight. Alert and oriented x 3. Normal mood.    Labs on Admission: I have personally reviewed following labs and imaging studies  CBC: Recent Labs  Lab 09/26/17 1922  WBC 11.3*  NEUTROABS 6.9  HGB 15.9*  HCT 44.9  MCV 88.9  PLT 277   Basic Metabolic Panel: Recent Labs  Lab 09/26/17 1922  NA 135  K 3.9  CL 103  CO2 20*  GLUCOSE 99  BUN 16  CREATININE 0.97  CALCIUM 8.8*   GFR: Estimated Creatinine Clearance: 56 mL/min (by C-G formula based on SCr of 0.97 mg/dL). Liver Function Tests: No results for input(s): AST, ALT, ALKPHOS, BILITOT, PROT, ALBUMIN in the last 168 hours. No results for input(s): LIPASE, AMYLASE in the last 168 hours. No results for input(s): AMMONIA in the last 168 hours. Coagulation Profile: No results for input(s): INR, PROTIME in the last 168 hours. Cardiac Enzymes: No results for input(s): CKTOTAL, CKMB, CKMBINDEX, TROPONINI in the last 168 hours. BNP (last 3 results) No results for input(s): PROBNP in the last 8760 hours. HbA1C: No results for  input(s): HGBA1C in the last 72 hours. CBG: No results for input(s): GLUCAP in the last 168 hours. Lipid Profile: No results for input(s): CHOL, HDL, LDLCALC, TRIG, CHOLHDL, LDLDIRECT in the last 72 hours. Thyroid Function Tests: No results for input(s): TSH, T4TOTAL, FREET4, T3FREE, THYROIDAB in the last 72 hours. Anemia Panel: No results for input(s): VITAMINB12, FOLATE, FERRITIN, TIBC, IRON, RETICCTPCT in the last 72 hours. Urine analysis: No results found for: COLORURINE, APPEARANCEUR, LABSPEC, PHURINE, GLUCOSEU, HGBUR, BILIRUBINUR, KETONESUR, PROTEINUR, UROBILINOGEN, NITRITE, LEUKOCYTESUR  Radiological Exams on Admission: Dg Chest 2 View  Result Date: 09/26/2017 CLINICAL DATA:  Chest pain EXAM: CHEST - 2 VIEW COMPARISON:  10/12/2009 FINDINGS: Subsegmental atelectasis or scar at the left base. No acute consolidation or pleural effusion. Mild cardiomegaly. Aortic atherosclerosis. No pneumothorax. Surgical clips in the cervical spine. Mild degenerative changes. IMPRESSION: 1. Atelectasis or scar at the left base 2. Mild cardiomegaly without edema or focal infiltrate Electronically Signed   By: Jasmine Pang M.D.   On: 09/26/2017 18:57    EKG: Independently reviewed.  Assessment/Plan Principal Problem:   Unstable angina (HCC) Active Problems:   HTN (hypertension)    1. Unstable angina / NSTEMI / CP with positive trop and Jenna [name] HEART score of 7 - 1. ACS pathway 2. Heparin gtt 3. CP free at this time 4. Will leave her on her metoprolol 50mg  BID for now 5. ASA 6. FLP in AM, but starting statin anyhow till someone decides otherwise. 7. Nicotine patch for smoking 8. Serial trops 9. Tele monitor 10. Cards consult 11. NPO after MN as I am suspicious that heart cath may be next step. 2. HTN - 1. Continue metoprolol for now  DVT prophylaxis: Heparin gtt Code Status: Full Family Communication: No family in room Disposition Plan: Home after admit, suspect she needs heart  cath. Consults called: EDP calling cards, will also put in message to P.Trent Admission status: Admit to IP - IP status for management of unstable angina / NSTEMI.   Hillary Bow DO [facility] Pager [phone]  If 7AM-7PM, please contact day team taking care of patient [email or web address] Password [facility]  09/26/2017, 9:26 PM

## 2017-09-26 NOTE — Progress Notes (Signed)
ANTICOAGULATION CONSULT NOTE  Pharmacy Consult for heparin Indication: chest pain/ACS  Heparin Dosing Weight: 65 kg  Labs: Recent Labs    09/26/17 1922  HGB 15.9*  HCT 44.9  PLT 277  CREATININE 0.97    Assessment: 67 yof presenting with CP. Pharmacy consulted to dose heparin for ACS. Not on anticoagulation PTA. CBC ok on admit, trop 0.13. No bleed documented.  Goal of Therapy:  Heparin level 0.3-0.7 units/ml Monitor platelets by anticoagulation protocol: Yes   Plan:  Heparin 3500 unit bolus Start heparin at 800 units/h 6h heparin level Daily heparin level/CBC Monitor s/sx bleeding F/u Cardiology plans  Babs BertinHaley Kriston Pasquarello, PharmD, BCPS Clinical Pharmacist 09/26/2017 9:04 PM

## 2017-09-27 ENCOUNTER — Encounter (HOSPITAL_COMMUNITY)
Admission: EM | Disposition: A | Discharge status: Home / Self Care | Payer: Self-pay | Source: Home / Self Care | Attending: Family Medicine

## 2017-09-27 DIAGNOSIS — Z8249 Family history of ischemic heart disease and other diseases of the circulatory system: Secondary | ICD-10-CM | POA: Insufficient documentation

## 2017-09-27 DIAGNOSIS — F17200 Nicotine dependence, unspecified, uncomplicated: Secondary | ICD-10-CM | POA: Insufficient documentation

## 2017-09-27 DIAGNOSIS — I200 Unstable angina: Secondary | ICD-10-CM | POA: Insufficient documentation

## 2017-09-27 DIAGNOSIS — E782 Mixed hyperlipidemia: Secondary | ICD-10-CM | POA: Insufficient documentation

## 2017-09-27 DIAGNOSIS — I214 Non-ST elevation (NSTEMI) myocardial infarction: Secondary | ICD-10-CM | POA: Insufficient documentation

## 2017-09-27 HISTORY — PX: LEFT HEART CATH AND CORONARY ANGIOGRAPHY: CATH118249

## 2017-09-27 LAB — CBC
HCT: 45.5 % (ref 36.0–46.0)
HCT: 47 % — ABNORMAL HIGH (ref 36.0–46.0)
Hemoglobin: 15.7 g/dL — ABNORMAL HIGH (ref 12.0–15.0)
Hemoglobin: 16.4 g/dL — ABNORMAL HIGH (ref 12.0–15.0)
MCH: 30.9 pg (ref 26.0–34.0)
MCH: 31.4 pg (ref 26.0–34.0)
MCHC: 34.5 g/dL (ref 30.0–36.0)
MCHC: 34.9 g/dL (ref 30.0–36.0)
MCV: 89.6 fL (ref 78.0–100.0)
MCV: 90 fL (ref 78.0–100.0)
PLATELETS: 266 10*3/uL (ref 150–400)
PLATELETS: 238 10*3/uL (ref 150–400)
RBC: 5.08 MIL/uL (ref 3.87–5.11)
RBC: 5.22 MIL/uL — ABNORMAL HIGH (ref 3.87–5.11)
RDW: 13 % (ref 11.5–15.5)
RDW: 13.3 % (ref 11.5–15.5)
WBC: 9.8 10*3/uL (ref 4.0–10.5)
WBC: 10.2 10*3/uL (ref 4.0–10.5)

## 2017-09-27 LAB — POCT ACTIVATED CLOTTING TIME: ACTIVATED CLOTTING TIME: 81 s

## 2017-09-27 LAB — LIPID PANEL
CHOLESTEROL: 210 mg/dL — AB (ref 0–200)
HDL: 26 mg/dL — AB (ref >40)
LDL Cholesterol: 145 mg/dL — ABNORMAL HIGH (ref 0–99)
Total CHOL/HDL Ratio: 8.1 RATIO
Triglycerides: 193 mg/dL — ABNORMAL HIGH (ref <150)
VLDL: 39 mg/dL (ref 0–40)

## 2017-09-27 LAB — HEPARIN LEVEL (UNFRACTIONATED)
Heparin Unfractionated: <0.1 IU/mL — ABNORMAL LOW (ref 0.30–0.70)
Heparin Unfractionated: 0.19 IU/mL — ABNORMAL LOW (ref 0.30–0.70)

## 2017-09-27 LAB — CREATININE, SERUM
Creatinine, Ser: 0.85 mg/dL (ref 0.44–1.00)
GFR calc non Af Amer: >60 mL/min (ref >60)

## 2017-09-27 LAB — TROPONIN I
Troponin I: 5.44 ng/mL (ref <0.03)
Troponin I: 4.63 ng/mL (ref <0.03)

## 2017-09-27 SURGERY — LEFT HEART CATH AND CORONARY ANGIOGRAPHY
Anesthesia: LOCAL

## 2017-09-27 MED ORDER — SODIUM CHLORIDE 0.9 % IV SOLN: 250.0000 mL | INTRAVENOUS | Status: DC | PRN

## 2017-09-27 MED ORDER — SODIUM CHLORIDE 0.9% FLUSH
3.0000 mL | Freq: Two times a day (BID) | INTRAVENOUS | Status: DC
  Administered 2017-09-28: 10:00:00 3 mL via INTRAVENOUS

## 2017-09-27 MED ORDER — ATORVASTATIN CALCIUM 80 MG PO TABS: 80.0000 mg | ORAL_TABLET | ORAL | Status: DC

## 2017-09-27 MED ORDER — MIDAZOLAM HCL 2 MG/2ML IJ SOLN
INTRAMUSCULAR | Status: AC
  Filled 2017-09-27: qty 2

## 2017-09-27 MED ORDER — ONDANSETRON HCL 4 MG/2ML IJ SOLN: 4.0000 mg | Freq: Four times a day (QID) | INTRAMUSCULAR | Status: DC | PRN

## 2017-09-27 MED ORDER — ATORVASTATIN CALCIUM 80 MG PO TABS
80.0000 mg | ORAL_TABLET | Freq: Every day | ORAL | Status: DC
  Administered 2017-09-27: 21:00:00 80 mg via ORAL
  Filled 2017-09-27: qty 1

## 2017-09-27 MED ORDER — IOHEXOL 350 MG/ML SOLN
INTRAVENOUS | Status: DC | PRN
  Administered 2017-09-27: 75 mL via INTRA_ARTERIAL

## 2017-09-27 MED ORDER — SODIUM CHLORIDE 0.9% FLUSH: 3.0000 mL | INTRAVENOUS | Status: DC | PRN

## 2017-09-27 MED ORDER — SODIUM CHLORIDE 0.9 % IV SOLN: INTRAVENOUS | Status: DC

## 2017-09-27 MED ORDER — HEPARIN (PORCINE) IN NACL 2-0.9 UNIT/ML-% IJ SOLN
INTRAMUSCULAR | Status: AC | PRN
  Administered 2017-09-27 (×2): 500 mL via INTRA_ARTERIAL

## 2017-09-27 MED ORDER — DIAZEPAM 5 MG PO TABS: 5.0000 mg | ORAL_TABLET | Freq: Four times a day (QID) | ORAL | Status: DC | PRN

## 2017-09-27 MED ORDER — ACETAMINOPHEN 325 MG PO TABS: 650.0000 mg | ORAL_TABLET | ORAL | Status: DC | PRN

## 2017-09-27 MED ORDER — ATORVASTATIN CALCIUM 80 MG PO TABS: 80.0000 mg | ORAL_TABLET | Freq: Every day | ORAL | Status: DC

## 2017-09-27 MED ORDER — FENTANYL CITRATE (PF) 100 MCG/2ML IJ SOLN
INTRAMUSCULAR | Status: DC | PRN
  Administered 2017-09-27: 25 ug via INTRAVENOUS

## 2017-09-27 MED ORDER — HEPARIN (PORCINE) IN NACL 2-0.9 UNIT/ML-% IJ SOLN
INTRAMUSCULAR | Status: AC
  Filled 2017-09-27: qty 1000

## 2017-09-27 MED ORDER — ASPIRIN 81 MG PO CHEW: 81.0000 mg | CHEWABLE_TABLET | ORAL | Status: DC

## 2017-09-27 MED ORDER — LIDOCAINE HCL (PF) 1 % IJ SOLN
INTRAMUSCULAR | Status: AC
  Filled 2017-09-27: qty 30

## 2017-09-27 MED ORDER — HEPARIN BOLUS VIA INFUSION
2000.0000 [IU] | Freq: Once | INTRAVENOUS | Status: AC
  Administered 2017-09-27: 2000 [IU] via INTRAVENOUS
  Filled 2017-09-27: qty 2000

## 2017-09-27 MED ORDER — LIDOCAINE HCL (PF) 1 % IJ SOLN
INTRAMUSCULAR | Status: DC | PRN
  Administered 2017-09-27: 2 mL
  Administered 2017-09-27: 20 mL

## 2017-09-27 MED ORDER — ASPIRIN 81 MG PO CHEW
81.0000 mg | CHEWABLE_TABLET | Freq: Every day | ORAL | Status: DC
  Administered 2017-09-28: 10:00:00 81 mg via ORAL
  Filled 2017-09-27: qty 1

## 2017-09-27 MED ORDER — SODIUM CHLORIDE 0.9 % IV SOLN
INTRAVENOUS | Status: DC
  Administered 2017-09-27: 21:00:00 via INTRAVENOUS

## 2017-09-27 MED ORDER — FENTANYL CITRATE (PF) 100 MCG/2ML IJ SOLN
INTRAMUSCULAR | Status: AC
  Filled 2017-09-27: qty 2

## 2017-09-27 MED ORDER — ENOXAPARIN SODIUM 40 MG/0.4ML ~~LOC~~ SOLN
40.0000 mg | SUBCUTANEOUS | Status: DC
  Administered 2017-09-28: 09:00:00 40 mg via SUBCUTANEOUS
  Filled 2017-09-27: qty 0.4

## 2017-09-27 MED ORDER — ATORVASTATIN CALCIUM 80 MG PO TABS
80.0000 mg | ORAL_TABLET | Freq: Every day | ORAL | Status: DC
  Administered 2017-09-27: 80 mg via ORAL
  Filled 2017-09-27: qty 1

## 2017-09-27 MED ORDER — ASPIRIN EC 81 MG PO TBEC: 81.0000 mg | DELAYED_RELEASE_TABLET | Freq: Every day | ORAL | Status: DC

## 2017-09-27 MED ORDER — SODIUM CHLORIDE 0.9% FLUSH: 3.0000 mL | Freq: Two times a day (BID) | INTRAVENOUS | Status: DC

## 2017-09-27 MED ORDER — HEPARIN SODIUM (PORCINE) 1000 UNIT/ML IJ SOLN
INTRAMUSCULAR | Status: AC
  Filled 2017-09-27: qty 1

## 2017-09-27 MED ORDER — VERAPAMIL HCL 2.5 MG/ML IV SOLN
INTRAVENOUS | Status: AC
  Filled 2017-09-27: qty 2

## 2017-09-27 MED ORDER — MIDAZOLAM HCL 2 MG/2ML IJ SOLN
INTRAMUSCULAR | Status: DC | PRN
  Administered 2017-09-27: 2 mg via INTRAVENOUS

## 2017-09-27 SURGICAL SUPPLY — 15 items
CATH INFINITI 5FR MULTPACK ANG (CATHETERS) ×2 IMPLANT
COVER PRB 48X5XTLSCP FOLD TPE (BAG) ×1 IMPLANT
COVER PROBE 5X48 (BAG) ×2
GUIDEWIRE INQWIRE 1.5J.035X260 (WIRE) IMPLANT
INQWIRE 1.5J .035X260CM (WIRE) ×2
KIT HEART LEFT (KITS) ×2 IMPLANT
NDL PERC 21GX4CM (NEEDLE) IMPLANT
NEEDLE PERC 21GX4CM (NEEDLE) ×2 IMPLANT
PACK CARDIAC CATHETERIZATION (CUSTOM PROCEDURE TRAY) ×2 IMPLANT
SHEATH AVANTI 11CM 5FR (SHEATH) ×1 IMPLANT
SHEATH RAIN RADIAL 21G 6FR (SHEATH) ×2 IMPLANT
SYR MEDRAD MARK V 150ML (SYRINGE) ×2 IMPLANT
TRANSDUCER W/STOPCOCK (MISCELLANEOUS) ×2 IMPLANT
TUBING CIL FLEX 10 FLL-RA (TUBING) ×2 IMPLANT
WIRE EMERALD 3MM-J .035X150CM (WIRE) ×2 IMPLANT

## 2017-09-27 NOTE — ED Notes (Signed)
Heparin verfied with Delorise Jacksonori, RN

## 2017-09-27 NOTE — ED Notes (Signed)
Clydie BraunKaren from [facility] called to request patient be brought to Lab 1

## 2017-09-27 NOTE — Interval H&P Note (Signed)
Cath Lab Visit (complete for each Cath Lab visit)  Clinical Evaluation Leading to the Procedure:   ACS: Yes.    Non-ACS:    Anginal Classification: CCS IV  Anti-ischemic medical therapy: Maximal Therapy (2 or more classes of medications)  Non-Invasive Test Results: No non-invasive testing performed  Prior CABG: No previous CABG      History and Physical Interval Note:  09/27/2017 4:20 PM  Jenna [name]  has presented today for surgery, with the diagnosis of cp  The various methods of treatment have been discussed with the patient and family. After consideration of risks, benefits and other options for treatment, the patient has consented to  Procedure(s): LEFT HEART CATH AND CORONARY ANGIOGRAPHY (N/A) as a surgical intervention .  The patient's history has been reviewed, patient examined, no change in status, stable for surgery.  I have reviewed the patient's chart and labs.  Questions were answered to the patient's satisfaction.     Jenna [name]

## 2017-09-27 NOTE — ED Triage Notes (Signed)
Pt presents with new onset chest pain starting approx 1610; had n/v and weakness with L chest pain radiating to jaw; EMS EKG normal

## 2017-09-27 NOTE — Progress Notes (Signed)
ANTICOAGULATION CONSULT NOTE  Pharmacy Consult for Heparin  Indication: chest pain/ACS  Allergies  Allergen Reactions  . Arlice ColtMustard [Allyl Isothiocyanate] Anaphylaxis  . Hctz [Hydrochlorothiazide] Hives    "swells, turns red, feels like it's burning"   Patient Measurements: Height: 4\' 11"  (149.9 cm) Weight: 200 lb (90.7 kg) IBW/kg (Calculated) : 43.2  Vital Signs: BP: 127/67 (04/16 1314) Pulse Rate: 69 (04/16 1314)  Labs: Recent Labs    09/26/17 1922 09/26/17 2110 09/27/17 0358 09/27/17 1253  HGB 15.9*  --  15.7*  --   HCT 44.9  --  45.5  --   PLT 277  --  266  --   HEPARINUNFRC  --   --  <0.10* 0.19*  CREATININE 0.97  --   --   --   TROPONINI  --  2.54* 5.44*  --     Assessment: Heparin for NSTEMI Trop + Likely cath today Initial level SUBtherapeutic  Goal of Therapy:  Heparin level 0.3-0.7 units/ml Monitor platelets by anticoagulation protocol: Yes   Plan:  Increase heparin to 1150 units/hr Daily HL, CBC Check level in 6 hours  Baldemar FridayMasters, Cherysh Epperly M 09/27/2017,1:48 PM

## 2017-09-27 NOTE — Consult Note (Addendum)
CARDIOLOGY CONSULT NOTE   Patient ID: Jenna LinkCarol A Breton MRN: [id number], DOB/AGE: 66-Oct-1952   Admit date: 09/26/2017 Date of Consult: 09/27/2017  Primary Physician: Patient, No Pcp Per Primary Cardiologist: New patient Referring physician: Dr Lyda PeroneJared Gardner  Reason for consult:  Chest pain, elevated troponin  Problem List  Past Medical History:  Diagnosis Date  . HTN (hypertension)   . Obesity, Class III, BMI 40-49.9 (morbid obesity) (HCC)   . Smoking     History reviewed. No pertinent surgical history.   Allergies  Allergies  Allergen Reactions  . Arlice ColtMustard [Allyl Isothiocyanate] Anaphylaxis  . Hctz [Hydrochlorothiazide] Hives    "swells, turns red, feels like it's burning"    HPI   [age] year old female, no prior cardiac history, strong FH of premature CAD, prior hyperlipidemia, previously treated then discontinued sec to hepatitis antibodies. Also smoker, and hypertension. She presented with a chest pain that radiated to her let arm and neck, resolved after arrival to ER with NTG. Now chest pain free. Troponin elevated.   Inpatient Medications  . aspirin EC  81 mg Oral Daily  . atorvastatin  40 mg Oral q1800  . metoprolol tartrate  50 mg Oral BID  .  morphine injection  4 mg Intravenous Once  . nicotine  21 mg Transdermal Daily  . oxybutynin  5 mg Oral Daily  . PARoxetine  40 mg Oral Daily    Family History Family History  Problem Relation Age of Onset  . Heart attack Father 2040  . Heart attack Sister 9950       s/p PPM after a "major heart attack"  . CAD Son 2341     Social History Social History   Socioeconomic History  . Marital status: Divorced    Spouse name: Not on file  . Number of children: Not on file  . Years of education: Not on file  . Highest education level: Not on file  Occupational History  . Not on file  Social Needs  . Financial resource strain: Not on file  . Food insecurity:    Worry: Not on file    Inability: Not on file  .  Transportation needs:    Medical: Not on file    Non-medical: Not on file  Tobacco Use  . Smoking status: Current Every Day Smoker    Packs/day: 1.00  . Tobacco comment: 1PPD since age 67  Substance and Sexual Activity  . Alcohol use: Not Currently  . Drug use: Not Currently  . Sexual activity: Not on file  Lifestyle  . Physical activity:    Days per week: Not on file    Minutes per session: Not on file  . Stress: Not on file  Relationships  . Social connections:    Talks on phone: Not on file    Gets together: Not on file    Attends religious service: Not on file    Active member of club or organization: Not on file    Attends meetings of clubs or organizations: Not on file    Relationship status: Not on file  . Intimate partner violence:    Fear of current or ex partner: Not on file    Emotionally abused: Not on file    Physically abused: Not on file    Forced sexual activity: Not on file  Other Topics Concern  . Not on file  Social History Narrative  . Not on file     Review of Systems  General:  No chills, fever, night sweats or weight changes.  Cardiovascular:  No chest pain, dyspnea on exertion, edema, orthopnea, palpitations, paroxysmal nocturnal dyspnea. Dermatological: No rash, lesions/masses Respiratory: No cough, dyspnea Urologic: No hematuria, dysuria Abdominal:   No nausea, vomiting, diarrhea, bright red blood per rectum, melena, or hematemesis Neurologic:  No visual changes, wkns, changes in mental status. All other systems reviewed and are otherwise negative except as noted above.  Physical Exam  Blood pressure (!) 156/75, pulse 71, temperature 98.1 F (36.7 C), temperature source Oral, resp. rate (!) 21, height 4\' 11"  (1.499 m), weight 200 lb (90.7 kg), SpO2 96 %.  General: Pleasant, NAD Psych: Normal affect. Neuro: Alert and oriented X 3. Moves all extremities spontaneously. HEENT: Normal  Neck: Supple without bruits or JVD. Lungs:  Resp regular  and unlabored, CTA. Heart: RRR no s3, s4, or murmurs. Abdomen: Soft, non-tender, non-distended, BS + x 4.  Extremities: No clubbing, cyanosis or edema. DP/PT/Radials 2+ and equal bilaterally.  Labs  Recent Labs    09/26/17 2110 09/27/17 0358  TROPONINI 2.54* 5.44*   Lab Results  Component Value Date   WBC 9.8 09/27/2017   HGB 15.7 (H) 09/27/2017   HCT 45.5 09/27/2017   MCV 89.6 09/27/2017   PLT 266 09/27/2017    Recent Labs  Lab 09/26/17 1922  NA 135  K 3.9  CL 103  CO2 20*  BUN 16  CREATININE 0.97  CALCIUM 8.8*  GLUCOSE 99   Lab Results  Component Value Date   CHOL 210 (H) 09/27/2017   HDL 26 (L) 09/27/2017   LDLCALC 145 (H) 09/27/2017   TRIG 193 (H) 09/27/2017   No results found for: DDIMER Invalid input(s): POCBNP  Radiology/Studies  Dg Chest 2 View  Result Date: 09/26/2017 CLINICAL DATA:  Chest pain EXAM: CHEST - 2 VIEW COMPARISON:  10/12/2009 FINDINGS: Subsegmental atelectasis or scar at the left base. No acute consolidation or pleural effusion. Mild cardiomegaly. Aortic atherosclerosis. No pneumothorax. Surgical clips in the cervical spine. Mild degenerative changes. IMPRESSION: 1. Atelectasis or scar at the left base 2. Mild cardiomegaly without edema or focal infiltrate Electronically Signed   By: Jasmine Pang M.D.   On: 09/26/2017 18:57   Echocardiogram - pending  ECG: SR, PVC   ASSESSMENT AND PLAN  1. NSTEMI troponin 0.13 ->2.54->5.4 -start lipitor 80 mg po daily -start ASA 81 mg po daily - start metoprolol 25 mg po BID - heparin drip - cath today - obtain echocardiogram  2. Hypertension - as above  3. Hyperlipidemia - as above  4. Smoking cessation - advised  Signed, Tobias Alexander, MD, [facility] 09/27/2017, 12:24 PM

## 2017-09-27 NOTE — ED Notes (Signed)
Blood draw attempted by this RN unsuccessful. Will notify phlebotomy

## 2017-09-27 NOTE — ED Notes (Signed)
Pt. Ambulated to bathroom with steady gait.  

## 2017-09-27 NOTE — Progress Notes (Signed)
Hospitalist progress note   Jenna LinkCarol A [name]  [id number] DOB: Dec 28, 1950 DOA: 09/26/2017 PCP: Patient, No Pcp Per  Specialists:  Dr Merri Brunettefina [facility]  Brief Narrative:  [age] fem admit with CP Also h/o htn,hld,copd smoker, djd, BMI 40, bipolar-tubular adenoma-possible palpitations in the pastPer patient  Assessment & Plan:   CP-Cardiology to see-cont IV heparin, ASA troponin peaking to-troponin peaking to 5.44 from 2.5.  Await repeat from this morning unclear if drawn.  Cardiology consulted by me and my understanding is attempt was made to contact them regarding elevated troponin last night. Will probably need evaluation of coronaries this admission-defer to cardiology management plans-continue metoprolo, statin--have held Aldactone 50 daily for now  Bipolar-continue Paxil 40 daily  HLD continue Lipitor 40 daily  Overactive bladder continue Ditropan 5 daily  Patient on phentermine-would not order in the future given her heart history  Consultants:   Cardiology none yet  Procedures:   None  Antimicrobials:   None  Subjective: Awake alert no chest pain ambulating around the unit going to the bathroom without issue   Objective: Vitals:   09/27/17 0445 09/27/17 0600 09/27/17 0700 09/27/17 0800  BP: 134/67 (!) 153/77 (!) 145/79 (!) 153/80  Pulse: 66 72 68 66  Resp: 16 (!) 21 16 17   Temp:      TempSrc:      SpO2: 95% 95% 94% 95%  Weight:      Height:       No intake or output data in the 24 hours ending 09/27/17 0906 Filed Weights   09/26/17 2054  Weight: 90.7 kg (200 lb)    Examination: Awake alert obese thick neck S1-S2 no murmur rub or gallop sinus rhythm on monitors no bruit No epigastric tenderness no rebound no guarding Abdomen soft nontender No lower extremity edema R OM intact   Data Reviewed: I have personally reviewed following labs and imaging studies  CBC: Recent Labs  Lab 09/26/17 1922 09/27/17 0358  WBC 11.3* 9.8  NEUTROABS 6.9  --   HGB  15.9* 15.7*  HCT 44.9 45.5  MCV 88.9 89.6  PLT 277 266   Basic Metabolic Panel: Recent Labs  Lab 09/26/17 1922  NA 135  K 3.9  CL 103  CO2 20*  GLUCOSE 99  BUN 16  CREATININE 0.97  CALCIUM 8.8*   GFR: Estimated Creatinine Clearance: 56 mL/min (by C-G formula based on SCr of 0.97 mg/dL). Liver Function Tests: No results for input(s): AST, ALT, ALKPHOS, BILITOT, PROT, ALBUMIN in the last 168 hours. No results for input(s): LIPASE, AMYLASE in the last 168 hours. No results for input(s): AMMONIA in the last 168 hours. Coagulation Profile: No results for input(s): INR, PROTIME in the last 168 hours. Cardiac Enzymes:  Radiology Studies: Reviewed images personally in health database   Scheduled Meds: . aspirin EC  81 mg Oral Daily  . atorvastatin  40 mg Oral q1800  . metoprolol tartrate  50 mg Oral BID  .  morphine injection  4 mg Intravenous Once  . nicotine  21 mg Transdermal Daily  . oxybutynin  5 mg Oral Daily  . PARoxetine  40 mg Oral Daily   Continuous Infusions: . heparin 950 Units/hr (09/27/17 0448)     LOS: 1 day    Time spent: 530  Pleas KochJai Janace Decker, MD [facility] ([facility]) [phone]  If 7PM-7AM, please contact night-coverage [email or web address] Password [email or web address] 09/27/2017, 9:06 AM

## 2017-09-27 NOTE — Plan of Care (Signed)
  Problem: Education: Goal: Knowledge of General Education information will improve Outcome: Progressing   Problem: Health Behavior/Discharge Planning: Goal: Ability to manage health-related needs will improve Outcome: Progressing   Problem: Clinical Measurements: Goal: Ability to maintain clinical measurements within normal limits will improve Outcome: Progressing Goal: Will remain free from infection Outcome: Progressing Goal: Diagnostic test results will improve Outcome: Progressing Goal: Respiratory complications will improve Outcome: Progressing Goal: Cardiovascular complication will be avoided Outcome: Progressing   Problem: Coping: Goal: Level of anxiety will decrease Outcome: Progressing   Problem: Pain Managment: Goal: General experience of comfort will improve Outcome: Progressing   Problem: Safety: Goal: Ability to remain free from injury will improve Outcome: Progressing   Problem: Education: Goal: Understanding of CV disease, CV risk reduction, and recovery process will improve Outcome: Progressing   Problem: Cardiovascular: Goal: Ability to achieve and maintain adequate cardiovascular perfusion will improve Outcome: Progressing Goal: Vascular access site(s) Level 0-1 will be maintained Outcome: Progressing   Problem: Health Behavior/Discharge Planning: Goal: Ability to safely manage health-related needs after discharge will improve Outcome: Progressing

## 2017-09-27 NOTE — Progress Notes (Signed)
ANTICOAGULATION CONSULT NOTE - Follow Up Consult  Pharmacy Consult for Heparin  Indication: chest pain/ACS  Allergies  Allergen Reactions  . Arlice ColtMustard [Allyl Isothiocyanate] Anaphylaxis  . Hctz [Hydrochlorothiazide] Hives    "swells, turns red, feels like it's burning"   Patient Measurements: Height: 4\' 11"  (149.9 cm) Weight: 200 lb (90.7 kg) IBW/kg (Calculated) : 43.2  Vital Signs: Temp: 98.1 F (36.7 C) (04/15 1827) Temp Source: Oral (04/15 1827) BP: 145/72 (04/16 0130) Pulse Rate: 77 (04/16 0130)  Labs: Recent Labs    09/26/17 1922 09/26/17 2110 09/27/17 0358  HGB 15.9*  --  15.7*  HCT 44.9  --  45.5  PLT 277  --  266  HEPARINUNFRC  --   --  <0.10*  CREATININE 0.97  --   --   TROPONINI  --  2.54*  --     Estimated Creatinine Clearance: 56 mL/min (by C-G formula based on SCr of 0.97 mg/dL).  Assessment: Heparin for CP, elevated troponin. Cards consult pending. Heparin level is undetectable.   Goal of Therapy:  Heparin level 0.3-0.7 units/ml Monitor platelets by anticoagulation protocol: Yes   Plan:  Heparin 2000 units [employer] heparin drip to 950 units/hr 1300 HL  Abran DukeLedford, Yuvraj Pfeifer 09/27/2017,4:28 AM

## 2017-09-27 NOTE — H&P (View-Only) (Signed)
CARDIOLOGY CONSULT NOTE   Patient ID: Jenna LinkCarol A Peckenpaugh MRN: [id number], DOB/AGE: May 04, 1951   Admit date: 09/26/2017 Date of Consult: 09/27/2017  Primary Physician: Patient, No Pcp Per Primary Cardiologist: New patient  Reason for consult:  Chest pain, elevated troponin  Problem List  Past Medical History:  Diagnosis Date  . HTN (hypertension)   . Obesity, Class III, BMI 40-49.9 (morbid obesity) (HCC)   . Smoking     History reviewed. No pertinent surgical history.   Allergies  Allergies  Allergen Reactions  . Arlice ColtMustard [Allyl Isothiocyanate] Anaphylaxis  . Hctz [Hydrochlorothiazide] Hives    "swells, turns red, feels like it's burning"    HPI   67 year old female, no prior cardiac history, strong FH of premature CAD, prior hyperlipidemia, previously treated then discontinued sec to hepatitis antibodies. Also smoker, and hypertension. She presented with a chest pain that radiated to her let arm and neck, resolved after arrival to ER with NTG. Now chest pain free. Troponin elevated.   Inpatient Medications  . aspirin EC  81 mg Oral Daily  . atorvastatin  40 mg Oral q1800  . metoprolol tartrate  50 mg Oral BID  .  morphine injection  4 mg Intravenous Once  . nicotine  21 mg Transdermal Daily  . oxybutynin  5 mg Oral Daily  . PARoxetine  40 mg Oral Daily    Family History Family History  Problem Relation Age of Onset  . Heart attack Father 1140  . Heart attack Sister 1150       s/p PPM after a "major heart attack"  . CAD Son 4641     Social History Social History   Socioeconomic History  . Marital status: Divorced    Spouse name: Not on file  . Number of children: Not on file  . Years of education: Not on file  . Highest education level: Not on file  Occupational History  . Not on file  Social Needs  . Financial resource strain: Not on file  . Food insecurity:    Worry: Not on file    Inability: Not on file  . Transportation needs:    Medical: Not on file     Non-medical: Not on file  Tobacco Use  . Smoking status: Current Every Day Smoker    Packs/day: 1.00  . Tobacco comment: 1PPD since age 67  Substance and Sexual Activity  . Alcohol use: Not Currently  . Drug use: Not Currently  . Sexual activity: Not on file  Lifestyle  . Physical activity:    Days per week: Not on file    Minutes per session: Not on file  . Stress: Not on file  Relationships  . Social connections:    Talks on phone: Not on file    Gets together: Not on file    Attends religious service: Not on file    Active member of club or organization: Not on file    Attends meetings of clubs or organizations: Not on file    Relationship status: Not on file  . Intimate partner violence:    Fear of current or ex partner: Not on file    Emotionally abused: Not on file    Physically abused: Not on file    Forced sexual activity: Not on file  Other Topics Concern  . Not on file  Social History Narrative  . Not on file     Review of Systems  General:  No chills, fever, night  sweats or weight changes.  Cardiovascular:  No chest pain, dyspnea on exertion, edema, orthopnea, palpitations, paroxysmal nocturnal dyspnea. Dermatological: No rash, lesions/masses Respiratory: No cough, dyspnea Urologic: No hematuria, dysuria Abdominal:   No nausea, vomiting, diarrhea, bright red blood per rectum, melena, or hematemesis Neurologic:  No visual changes, wkns, changes in mental status. All other systems reviewed and are otherwise negative except as noted above.  Physical Exam  Blood pressure (!) 156/75, pulse 71, temperature 98.1 F (36.7 C), temperature source Oral, resp. rate (!) 21, height 4\' 11"  (1.499 m), weight 200 lb (90.7 kg), SpO2 96 %.  General: Pleasant, NAD Psych: Normal affect. Neuro: Alert and oriented X 3. Moves all extremities spontaneously. HEENT: Normal  Neck: Supple without bruits or JVD. Lungs:  Resp regular and unlabored, CTA. Heart: RRR no s3, s4, or  murmurs. Abdomen: Soft, non-tender, non-distended, BS + x 4.  Extremities: No clubbing, cyanosis or edema. DP/PT/Radials 2+ and equal bilaterally.  Labs  Recent Labs    09/26/17 2110 09/27/17 0358  TROPONINI 2.54* 5.44*   Lab Results  Component Value Date   WBC 9.8 09/27/2017   HGB 15.7 (H) 09/27/2017   HCT 45.5 09/27/2017   MCV 89.6 09/27/2017   PLT 266 09/27/2017    Recent Labs  Lab 09/26/17 1922  NA 135  K 3.9  CL 103  CO2 20*  BUN 16  CREATININE 0.97  CALCIUM 8.8*  GLUCOSE 99   Lab Results  Component Value Date   CHOL 210 (H) 09/27/2017   HDL 26 (L) 09/27/2017   LDLCALC 145 (H) 09/27/2017   TRIG 193 (H) 09/27/2017   No results found for: DDIMER Invalid input(s): POCBNP  Radiology/Studies  Dg Chest 2 View  Result Date: 09/26/2017 CLINICAL DATA:  Chest pain EXAM: CHEST - 2 VIEW COMPARISON:  10/12/2009 FINDINGS: Subsegmental atelectasis or scar at the left base. No acute consolidation or pleural effusion. Mild cardiomegaly. Aortic atherosclerosis. No pneumothorax. Surgical clips in the cervical spine. Mild degenerative changes. IMPRESSION: 1. Atelectasis or scar at the left base 2. Mild cardiomegaly without edema or focal infiltrate Electronically Signed   By: Jasmine Pang M.D.   On: 09/26/2017 18:57   Echocardiogram - pending  ECG: SR, PVC   ASSESSMENT AND PLAN  1. NSTEMI troponin 0.13 ->2.54->5.4 -start lipitor 80 mg po daily -start ASA 81 mg po daily - start metoprolol 25 mg po BID - heparin drip - cath today - obtain echocardiogram  2. Hypertension - as above  3. Hyperlipidemia - as above  4. Smoking cessation - advised  Signed, Tobias Alexander, MD, [facility] 09/27/2017, 12:24 PM

## 2017-09-27 NOTE — ED Notes (Signed)
Admitting at bedside speaking with pt.

## 2017-09-28 ENCOUNTER — Inpatient Hospital Stay (HOSPITAL_COMMUNITY): Payer: Medicare Other

## 2017-09-28 ENCOUNTER — Encounter (HOSPITAL_COMMUNITY): Payer: Self-pay | Admitting: Cardiovascular Disease

## 2017-09-28 DIAGNOSIS — R079 Chest pain, unspecified: Secondary | ICD-10-CM

## 2017-09-28 DIAGNOSIS — E782 Mixed hyperlipidemia: Secondary | ICD-10-CM

## 2017-09-28 DIAGNOSIS — F17200 Nicotine dependence, unspecified, uncomplicated: Secondary | ICD-10-CM

## 2017-09-28 DIAGNOSIS — I214 Non-ST elevation (NSTEMI) myocardial infarction: Principal | ICD-10-CM

## 2017-09-28 DIAGNOSIS — Z8249 Family history of ischemic heart disease and other diseases of the circulatory system: Secondary | ICD-10-CM

## 2017-09-28 LAB — ECHOCARDIOGRAM COMPLETE
Height: 59 in
Weight: 3209.9 oz

## 2017-09-28 LAB — BASIC METABOLIC PANEL
Anion gap: 52 — ABNORMAL HIGH (ref 5–15)
Anion gap: 8 (ref 5–15)
BUN: 10 mg/dL (ref 6–20)
BUN: 10 mg/dL (ref 6–20)
CHLORIDE: 89 mmol/L — AB (ref 101–111)
CHLORIDE: 108 mmol/L (ref 101–111)
CO2: 14 mmol/L — AB (ref 22–32)
CO2: 19 mmol/L — AB (ref 22–32)
CREATININE: 0.73 mg/dL (ref 0.44–1.00)
CREATININE: 0.86 mg/dL (ref 0.44–1.00)
Calcium: 5 mg/dL — CL (ref 8.9–10.3)
Calcium: 8.5 mg/dL — ABNORMAL LOW (ref 8.9–10.3)
GFR calc Af Amer: >60 mL/min (ref >60)
GFR calc Af Amer: >60 mL/min (ref >60)
GFR calc non Af Amer: >60 mL/min (ref >60)
GFR calc non Af Amer: >60 mL/min (ref >60)
Glucose, Bld: 90 mg/dL (ref 65–99)
Glucose, Bld: 112 mg/dL — ABNORMAL HIGH (ref 65–99)
Potassium: 3.6 mmol/L (ref 3.5–5.1)
Potassium: 4.1 mmol/L (ref 3.5–5.1)
Sodium: 155 mmol/L — ABNORMAL HIGH (ref 135–145)
Sodium: 135 mmol/L (ref 135–145)

## 2017-09-28 LAB — CBC
HCT: 44.8 % (ref 36.0–46.0)
Hemoglobin: 15.5 g/dL — ABNORMAL HIGH (ref 12.0–15.0)
MCH: 31.4 pg (ref 26.0–34.0)
MCHC: 34.6 g/dL (ref 30.0–36.0)
MCV: 90.7 fL (ref 78.0–100.0)
Platelets: 145 10*3/uL — ABNORMAL LOW (ref 150–400)
RBC: 4.94 MIL/uL (ref 3.87–5.11)
RDW: 13.3 % (ref 11.5–15.5)
WBC: 9.5 10*3/uL (ref 4.0–10.5)

## 2017-09-28 MED ORDER — LISINOPRIL 5 MG PO TABS: 5.0000 mg | ORAL_TABLET | Freq: Every day | ORAL | 0 refills | Status: DC

## 2017-09-28 MED ORDER — ATORVASTATIN CALCIUM 80 MG PO TABS: 80.0000 mg | ORAL_TABLET | Freq: Every day | ORAL | 0 refills | Status: AC

## 2017-09-28 MED ORDER — LISINOPRIL 5 MG PO TABS
5.0000 mg | ORAL_TABLET | Freq: Every day | ORAL | Status: DC
  Administered 2017-09-28: 10:00:00 5 mg via ORAL
  Filled 2017-09-28: qty 1

## 2017-09-28 MED ORDER — ASPIRIN 81 MG PO CHEW: 81.0000 mg | CHEWABLE_TABLET | Freq: Every day | ORAL | 0 refills | Status: DC

## 2017-09-28 MED ORDER — PNEUMOCOCCAL VAC POLYVALENT 25 MCG/0.5ML IJ INJ
0.5000 mL | INJECTION | INTRAMUSCULAR | Status: AC
  Administered 2017-09-28: 15:00:00 0.5 mL via INTRAMUSCULAR
  Filled 2017-09-28: qty 0.5

## 2017-09-28 MED FILL — Verapamil HCl IV Soln 2.5 MG/ML: INTRAVENOUS | Qty: 2 | Status: AC

## 2017-09-28 MED FILL — Heparin Sod (Porcine)-NaCl IV Soln 1000 Unit/500ML-0.9%: INTRAVENOUS | Qty: 1000 | Status: AC

## 2017-09-28 NOTE — Progress Notes (Signed)
Progress Note  Patient Name: Jenna [name] Date of Encounter: 09/28/2017  Primary Cardiologist: No primary care provider on file.   Subjective   She is feeling better, no chest pain, no pain in the groin or right foot.  Inpatient Medications    Scheduled Meds: . aspirin  81 mg Oral Daily  . atorvastatin  80 mg Oral q1800  . enoxaparin (LOVENOX) injection  40 mg Subcutaneous Q24H  . metoprolol tartrate  50 mg Oral BID  .  morphine injection  4 mg Intravenous Once  . nicotine  21 mg Transdermal Daily  . oxybutynin  5 mg Oral Daily  . PARoxetine  40 mg Oral Daily  . sodium chloride flush  3 mL Intravenous Q12H   Continuous Infusions: . sodium chloride    . sodium chloride Stopped (09/28/17 0421)  . sodium chloride     PRN Meds: sodium chloride, acetaminophen, albuterol, diazepam, nitroGLYCERIN, ondansetron (ZOFRAN) IV, sodium chloride flush   Vital Signs    Vitals:   09/27/17 1930 09/27/17 2000 09/28/17 0307 09/28/17 0741  BP: (!) 164/87 (!) 146/72 (!) 152/72 136/77  Pulse: 73 78 68 72  Resp: 17 15 (!) 22 20  Temp: 98.2 F (36.8 C)  98 F (36.7 C) (!) 97.4 F (36.3 C)  TempSrc: Oral  Oral Oral  SpO2: 99% 99% 96% 98%  Weight:   200 lb 9.9 oz (91 kg)   Height:        Intake/Output Summary (Last 24 hours) at 09/28/2017 0911 Last data filed at 09/28/2017 0700 Gross per 24 hour  Intake 480 ml  Output -  Net 480 ml   Filed Weights   09/26/17 2054 09/28/17 0307  Weight: 200 lb (90.7 kg) 200 lb 9.9 oz (91 kg)   Telemetry    SR, PVCs - Personally Reviewed  ECG    SR - Personally Reviewed  Physical Exam   GEN: No acute distress.   Neck: No JVD Cardiac: RRR, no murmurs, rubs, or gallops.  Respiratory: Clear to auscultation bilaterally. GI: Soft, nontender, non-distended  MS: No edema; No deformity. Neuro:  Nonfocal  Psych: Normal affect  No bruit in the right groin, good peripheral pulses  Labs    Chemistry Recent Labs  Lab 09/26/17 1922  09/27/17 1833 09/28/17 0258 09/28/17 0607  NA 135  --  155* 135  K 3.9  --  3.6 4.1  CL 103  --  89* 108  CO2 20*  --  14* 19*  GLUCOSE 99  --  90 112*  BUN 16  --  10 10  CREATININE 0.97 0.85 0.73 0.86  CALCIUM 8.8*  --  5.0* 8.5*  GFRNONAA 60* >60 >60 >60  GFRAA >60 >60 >60 >60  ANIONGAP 12  --  52* 8    Hematology Recent Labs  Lab 09/27/17 0358 09/27/17 1833 09/28/17 0258  WBC 9.8 10.2 9.5  RBC 5.08 5.22* 4.94  HGB 15.7* 16.4* 15.5*  HCT 45.5 47.0* 44.8  MCV 89.6 90.0 90.7  MCH 30.9 31.4 31.4  MCHC 34.5 34.9 34.6  RDW 13.0 13.3 13.3  PLT 266 238 145*   Cardiac Enzymes Recent Labs  Lab 09/26/17 2110 09/27/17 0358 09/27/17 1300  TROPONINI 2.54* 5.44* 4.63*    Recent Labs  Lab 09/26/17 1931  TROPIPOC 0.13*    BNPNo results for input(s): BNP, PROBNP in the last 168 hours.   DDimer No results for input(s): DDIMER in the last 168 hours.   Radiology  Dg Chest 2 View  Result Date: 09/26/2017 CLINICAL DATA:  Chest pain EXAM: CHEST - 2 VIEW COMPARISON:  10/12/2009 FINDINGS: Subsegmental atelectasis or scar at the left base. No acute consolidation or pleural effusion. Mild cardiomegaly. Aortic atherosclerosis. No pneumothorax. Surgical clips in the cervical spine. Mild degenerative changes. IMPRESSION: 1. Atelectasis or scar at the left base 2. Mild cardiomegaly without edema or focal infiltrate Electronically Signed   By: Jasmine PangKim  Fujinaga M.D.   On: 09/26/2017 18:57   Cardiac Studies    Ost Ramus lesion is 50% stenosed.  Ramus lesion is 60% stenosed.  Prox RCA lesion is 30% stenosed.  Prox RCA to Mid RCA lesion is 45% stenosed.  Mid RCA lesion is 20% stenosed.  LV end diastolic pressure is normal.  The left ventricular systolic function is normal.   Preserved global LV function with an ejection fraction of 60-65% and a small region of mid distal inferior hypocontractility.  Evidence for coronary calcification involving the left main, LAD and  RCA.  Mild 10-20% focal narrowing at the ostium of the first diagonal branch of the LAD; 50-60% stenosis in a small caliber ramus intermediate vessel, less than 2 mm in diameter; normal left circumflex Artery; dominant RCA with mild calcification and 30% proximal 40-50% mid 20% stenosis proximal to the acute margin.  RECOMMENDATION: Medical therapy will be initiated.  Smoking cessation is essential.  High potency statin therapy in attempt to induce plaque regression.    Patient Profile     67 y.o. female   Assessment & Plan    1. NSTEMI  - cath showed moderate non-obstructive CAD - aggressive medical therapy  2. Hypertension - add lisinopril 5 mg po daily  3 Hyperlipidemia - start atorvastatin 80 mg PO daily  4. Smoking cessation - consulted  She can be discharged, we will arrange for an outpatient follow up.  For questions or updates, please contact [facility] Please consult [email or web address] for contact info under Cardiology/STEMI.     Signed, Tobias AlexanderKatarina Ceylin Dreibelbis, MD  09/28/2017, 9:11 AM

## 2017-09-28 NOTE — Discharge Summary (Signed)
Physician Discharge Summary  Patient ID: Jenna LinkCarol A Bacchi MRN: [id number] DOB/AGE: Jul 09, 1950 67 y.o.  Admit date: 09/26/2017 Discharge date: 09/28/2017  Admission Diagnoses:  Discharge Diagnoses:  Principal Problem:   Unstable angina (HCC) Active Problems:   HTN (hypertension)   Discharged Condition: stable  Hospital Course: Patient is a 67 year old Caucasian female with past medical history significant for morbid obesity, hypertension, and tobacco use.  The patient has family history there is quite significant for coronary artery disease.  The patient presented with typical anginal symptoms, with radiation of pain to the neck.  Appointment was elevated.  Patient was admitted for further assessment and management.  Troponin was cycled.  Cardiology team was consulted.  The patient presented with cardiac catheterization.  Cardiac catheterization revealed coronary artery disease in multiple places, but not significant enough to require stenting.  Medical management has been advised.  Patient is currently chest pain-free.  Patient has been cleared for discharge by the cardiology team.  Patient will be discharged back on to the care of the primary care provider and the cardiology team.  Consults: cardiology  Significant Diagnostic Studies: labs: Elevated troponin.  Discharge Exam: Blood pressure 122/65, pulse 63, temperature 98.2 F (36.8 C), resp. rate (!) 21, height 4\' 11"  (1.499 m), weight 91 kg (200 lb 9.9 oz), SpO2 98 %.   Disposition: Discharge disposition: 01-Home or Self Care       Discharge Instructions    Call MD for:   Complete by:  As directed    Please call MD if the symptoms worsen   Diet - low sodium heart healthy   Complete by:  As directed    Increase activity slowly   Complete by:  As directed      Allergies as of 09/28/2017      Reactions   Mustard Jonelle Sports[allyl Isothiocyanate] Anaphylaxis   Hctz [hydrochlorothiazide] Hives   "swells, turns red, feels like it's  burning"      Medication List    STOP taking these medications   albuterol (2.5 MG/3ML) 0.083% nebulizer solution Commonly known as:  PROVENTIL   albuterol 108 (90 Base) MCG/ACT inhaler Commonly known as:  PROVENTIL HFA;VENTOLIN HFA   naproxen sodium 220 MG tablet Commonly known as:  ALEVE   phentermine 15 MG capsule   spironolactone 50 MG tablet Commonly known as:  ALDACTONE     TAKE these medications   aspirin 81 MG chewable tablet Chew 1 tablet (81 mg total) by mouth daily. Start taking on:  09/29/2017   atorvastatin 80 MG tablet Commonly known as:  LIPITOR Take 1 tablet (80 mg total) by mouth daily at 6 PM.   cetirizine 10 MG tablet Commonly known as:  ZYRTEC Take 10 mg by mouth every morning.   lisinopril 5 MG tablet Commonly known as:  PRINIVIL,ZESTRIL Take 1 tablet (5 mg total) by mouth daily. Start taking on:  09/29/2017   metoprolol tartrate 50 MG tablet Commonly known as:  LOPRESSOR Take 50 mg by mouth 2 (two) times daily.   oxybutynin 5 MG tablet Commonly known as:  DITROPAN Take 5 mg by mouth every morning.   PARoxetine 20 MG tablet Commonly known as:  PAXIL Take 40 mg by mouth daily.      Follow-up Information    Dyann KiefLenze, Michele M, PA-C Follow up on 10/12/2017.   Specialty:  Cardiology Why:  Your appointment will be on 10/12/17 at 12pm with Jacolyn ReedyMichele Lenze, PA.  Contact information: [address] [place]  [address] [phone]           Signed: Barnetta Chapel 09/28/2017, 1:01 PM

## 2017-09-28 NOTE — Progress Notes (Signed)
  Echocardiogram 2D Echocardiogram has been performed.  Jenna SavoyCasey N [name] 09/28/2017, 1:36 PM

## 2017-10-12 ENCOUNTER — Ambulatory Visit: Payer: Medicare Other | Admitting: Physician Assistant

## 2017-10-12 ENCOUNTER — Encounter: Payer: Self-pay | Admitting: Physician Assistant

## 2017-10-12 VITALS — BP 130/72 | HR 56 | Ht 59.0 in | Wt 205.0 lb

## 2017-10-12 DIAGNOSIS — I10 Essential (primary) hypertension: Secondary | ICD-10-CM

## 2017-10-12 DIAGNOSIS — Z8249 Family history of ischemic heart disease and other diseases of the circulatory system: Secondary | ICD-10-CM | POA: Diagnosis not present

## 2017-10-12 DIAGNOSIS — E782 Mixed hyperlipidemia: Secondary | ICD-10-CM | POA: Diagnosis not present

## 2017-10-12 DIAGNOSIS — F17200 Nicotine dependence, unspecified, uncomplicated: Secondary | ICD-10-CM

## 2017-10-12 DIAGNOSIS — E66813 Obesity, class 3: Secondary | ICD-10-CM

## 2017-10-12 DIAGNOSIS — I214 Non-ST elevation (NSTEMI) myocardial infarction: Secondary | ICD-10-CM

## 2017-10-12 DIAGNOSIS — IMO0001 Reserved for inherently not codable concepts without codable children: Secondary | ICD-10-CM

## 2017-10-12 DIAGNOSIS — Z6841 Body Mass Index (BMI) 40.0 and over, adult: Secondary | ICD-10-CM

## 2017-10-12 DIAGNOSIS — E6609 Other obesity due to excess calories: Secondary | ICD-10-CM | POA: Insufficient documentation

## 2017-10-12 NOTE — Progress Notes (Signed)
Cardiology Office Note    Date:  10/12/2017   ID:  EDITA [name], DOB January 27, 1951, MRN [id number]  PCP:  Ward, Clois Comber, FNP  Cardiologist: Tobias Alexander, MD  Chief Complaint  Patient presents with  . Hospitalization Follow-up    History of Present Illness:  Jenna [name] is a 67 y.o. female with history of CAD status post NSTEMI 09/27/2017 with cardiac cath showing moderate nonobstructive CAD recommend aggressive medical therapy.  50% ostial ramus, 60% ramus, 30% proximal RCA, 45% proximal to mid RCA, 20% mid RCA normal LVEF 60 to 65%.  Smoking cessation recommended and hypodensity statin.  Patient also has hypertension, HLD.  Patient here for follow up. Feels great. Smoking less than a pack a day, down from 1 1/2-3 packs/day. Says she'll try to cut back but not willing to quit. Inhalers were stopped in the hospital and she says she needs them with high humdity. Not exercising at all. Hallucinations on Chantix.  Nicotine patches in the past did not help.  Past Medical History:  Diagnosis Date  . Anxiety   . Arthritis   . Chest pain 09/2017  . COPD (chronic obstructive pulmonary disease) (HCC)   . Depression   . HTN (hypertension)   . Obesity, Class III, BMI 40-49.9 (morbid obesity) (HCC)   . Sleep apnea    history of sleep apnea  . Smoking     Past Surgical History:  Procedure Laterality Date  . ABDOMINAL HYSTERECTOMY    . BREAST LUMPECTOMY    . CARPAL TUNNEL RELEASE     bilteral  . CERVICAL DISC SURGERY    . COLONOSCOPY  07/2017  . HEMORROIDECTOMY    . LEFT HEART CATH AND CORONARY ANGIOGRAPHY N/A 09/27/2017   Procedure: LEFT HEART CATH AND CORONARY ANGIOGRAPHY;  Surgeon: Lennette Bihari, MD;  Location: [facility];  Service: Cardiovascular;  Laterality: N/A;  . TONSILLECTOMY      Current Medications: Current Meds  Medication Sig  . aspirin 81 MG chewable tablet Chew 1 tablet (81 mg total) by mouth daily.  Marland Kitchen atorvastatin (LIPITOR) 80 MG tablet Take 1  tablet (80 mg total) by mouth daily at 6 PM.  . cetirizine (ZYRTEC) 10 MG tablet Take 10 mg by mouth every morning.  Marland Kitchen lisinopril (PRINIVIL,ZESTRIL) 10 MG tablet Take 10 mg by mouth daily.  . metoprolol tartrate (LOPRESSOR) 50 MG tablet Take 50 mg by mouth 2 (two) times daily.  Marland Kitchen oxybutynin (DITROPAN) 5 MG tablet Take 5 mg by mouth every morning.  Marland Kitchen PARoxetine (PAXIL) 20 MG tablet Take 40 mg by mouth daily.     Allergies:   Arlice Colt isothiocyanate] and Hctz [hydrochlorothiazide]   Social History   Socioeconomic History  . Marital status: Divorced    Spouse name: Not on file  . Number of children: Not on file  . Years of education: Not on file  . Highest education level: Not on file  Occupational History  . Not on file  Social Needs  . Financial resource strain: Not on file  . Food insecurity:    Worry: Not on file    Inability: Not on file  . Transportation needs:    Medical: Not on file    Non-medical: Not on file  Tobacco Use  . Smoking status: Current Every Day Smoker    Packs/day: 1.00    Types: Cigarettes  . Smokeless tobacco: Never Used  . Tobacco comment: 1PPD since age 58  Substance and Sexual  Activity  . Alcohol use: Yes    Comment: rare  . Drug use: Not Currently  . Sexual activity: Not on file  Lifestyle  . Physical activity:    Days per week: Not on file    Minutes per session: Not on file  . Stress: Not on file  Relationships  . Social connections:    Talks on phone: Not on file    Gets together: Not on file    Attends religious service: Not on file    Active member of club or organization: Not on file    Attends meetings of clubs or organizations: Not on file    Relationship status: Not on file  Other Topics Concern  . Not on file  Social History Narrative  . Not on file     Family History:  The patient's family history includes CAD (age of onset: 12) in her son; Heart attack (age of onset: 41) in her father; Heart attack (age of onset:  65) in her sister.   ROS:   Please see the history of present illness.    Review of Systems  Constitution: Negative.  HENT: Negative.   Eyes: Negative.   Cardiovascular: Positive for dyspnea on exertion.  Respiratory: Positive for cough.   Hematologic/Lymphatic: Negative.   Musculoskeletal: Negative.  Negative for joint pain.  Gastrointestinal: Negative.   Genitourinary: Negative.   Neurological: Negative.    All other systems reviewed and are negative.   PHYSICAL EXAM:   VS:  BP 130/72   Pulse (!) 56   Ht 4\' 11"  (1.499 m)   Wt 205 lb (93 kg)   LMP  (LMP Unknown)   BMI 41.40 kg/m   Physical Exam  GEN: Obese, in no acute distress  Neck: no JVD, carotid bruits, or masses Cardiac:RRR; no murmurs, rubs, or gallops  Respiratory:  clear to auscultation bilaterally, normal work of breathing GI: soft, nontender, nondistended, + BS Ext: Right groin at cath site without hematoma or hemorrhage otherwise lower extremities without cyanosis, clubbing, or edema, Good distal pulses bilaterally Neuro:  Alert and Oriented x 3 Psych: euthymic mood, full affect  Wt Readings from Last 3 Encounters:  10/12/17 205 lb (93 kg)  09/28/17 200 lb 9.9 oz (91 kg)      Studies/Labs Reviewed:   EKG:  EKG is not ordered today.   Recent Labs: 09/28/2017: BUN 10; Creatinine, Ser 0.86; Hemoglobin 15.5; Platelets 145; Potassium 4.1; Sodium 135   Lipid Panel    Component Value Date/Time   CHOL 210 (H) 09/27/2017 0358   TRIG 193 (H) 09/27/2017 0358   HDL 26 (L) 09/27/2017 0358   CHOLHDL 8.1 09/27/2017 0358   VLDL 39 09/27/2017 0358   LDLCALC 145 (H) 09/27/2017 0358    Additional studies/ records that were reviewed today include:  Echo 4/17/19Study Conclusions   - Left ventricle: The cavity size was normal. Wall thickness was   normal. Systolic function was normal. The estimated ejection   fraction was in the range of 55% to 60%. Wall motion was normal;   there were no regional wall motion  abnormalities. Features are   consistent with a pseudonormal left ventricular filling pattern,   with concomitant abnormal relaxation and increased filling   pressure (grade 2 diastolic dysfunction). - Aortic valve: There was mild regurgitation. - Mitral valve: Moderately calcified annulus. There was mild   regurgitation. - Left atrium: The atrium was mildly dilated. - Pulmonary arteries: Systolic pressure was mildly increased. PA  peak pressure: 41 mm Hg (S).  Cardiac cath 4/16/19Conclusion      Ost Ramus lesion is 50% stenosed.  Ramus lesion is 60% stenosed.  Prox RCA lesion is 30% stenosed.  Prox RCA to Mid RCA lesion is 45% stenosed.  Mid RCA lesion is 20% stenosed.  LV end diastolic pressure is normal.  The left ventricular systolic function is normal.   Preserved global LV function with an ejection fraction of 60-65% and a small region of mid distal inferior hypocontractility.   Evidence for coronary calcification involving the left main, LAD and RCA.   Mild 10-20% focal narrowing at the ostium of the first diagonal branch of the LAD; 50-60% stenosis in a small caliber ramus intermediate vessel, less than 2 mm in diameter; normal left circumflex Artery; dominant RCA with mild calcification and 30% proximal 40-50% mid 20% stenosis proximal to the acute margin.   RECOMMENDATION: Medical therapy will be initiated.  Smoking cessation is essential.  High potency statin therapy in attempt to induce plaque regression.        ASSESSMENT:    1. NSTEMI (non-ST elevated myocardial infarction) (HCC)   2. Essential hypertension   3. Mixed hyperlipidemia   4. Family history of early CAD   5. Smoking   6. Class 3 severe obesity due to excess calories with serious comorbidity and body mass index (BMI) of 40.0 to 44.9 in adult [facility])      PLAN:  In order of problems listed above:  NSTEMI with moderate nonobstructive CAD on cath.  No recurrent angina.  Continue aspirin  Lipitor and metoprolol.  Follow-up with Dr. Delton See in 2 to 3 months.  Essential hypertension controlled with lisinopril and metoprolol  Mixed hyperlipidemia on Lipitor.  Check fasting lipid panel and LFTs in 6 weeks  Family history of early CAD risk factor modification essential.  Smoking history- I had a long discussion about smoking cessation with the patient.  Patient said she will cut back but has no intention of quitting.  Offered her smoking cessation courses or office visit but she declined.  Obesity exercise and weight loss program recommended.  Medication Adjustments/Labs and Tests Ordered: Current medicines are reviewed at length with the patient today.  Concerns regarding medicines are outlined above.  Medication changes, Labs and Tests ordered today are listed in the Patient Instructions below. Patient Instructions  Medication Instructions:  Your physician recommends that you continue on your current medications as directed. Please refer to the Current Medication list given to you today.  Labwork: Your physician recommends that you return for lab work in: 6 weeks for fasting lipid and liver panel.  Testing/Procedures: NONE  Follow-Up: Your physician wants you to follow-up next available with Dr. Delton See .     If you need a refill on your cardiac medications before your next appointment, please call your pharmacy.    Steps to Quit Smoking Smoking tobacco can be bad for your health. It can also affect almost every organ in your body. Smoking puts you and people around you at risk for many serious long-lasting (chronic) diseases. Quitting smoking is hard, but it is one of the best things that you can do for your health. It is never too late to quit. What are the benefits of quitting smoking? When you quit smoking, you lower your risk for getting serious diseases and conditions. They can include:  Lung cancer or lung disease.  Heart disease.  Stroke.  Heart  attack.  Not being able to  have children (infertility).  Weak bones (osteoporosis) and broken bones (fractures).  If you have coughing, wheezing, and shortness of breath, those symptoms may get better when you quit. You may also get sick less often. If you are pregnant, quitting smoking can help to lower your chances of having a baby of low birth weight. What can I do to help me quit smoking? Talk with your doctor about what can help you quit smoking. Some things you can do (strategies) include:  Quitting smoking totally, instead of slowly cutting back how much you smoke over a period of time.  Going to in-person counseling. You are more likely to quit if you go to many counseling sessions.  Using resources and support systems, such as: ? Agricultural engineer with a Veterinary surgeon. ? Phone quitlines. ? Automotive engineer. ? Support groups or group counseling. ? Text messaging programs. ? Mobile phone apps or applications.  Taking medicines. Some of these medicines may have nicotine in them. If you are pregnant or breastfeeding, do not take any medicines to quit smoking unless your doctor says it is okay. Talk with your doctor about counseling or other things that can help you.  Talk with your doctor about using more than one strategy at the same time, such as taking medicines while you are also going to in-person counseling. This can help make quitting easier. What things can I do to make it easier to quit? Quitting smoking might feel very hard at first, but there is a lot that you can do to make it easier. Take these steps:  Talk to your family and friends. Ask them to support and encourage you.  Call phone quitlines, reach out to support groups, or work with a Veterinary surgeon.  Ask people who smoke to not smoke around you.  Avoid places that make you want (trigger) to smoke, such as: ? Bars. ? Parties. ? Smoke-break areas at work.  Spend time with people who do not smoke.  Lower the  stress in your life. Stress can make you want to smoke. Try these things to help your stress: ? Getting regular exercise. ? Deep-breathing exercises. ? Yoga. ? Meditating. ? Doing a body scan. To do this, close your eyes, focus on one area of your body at a time from head to toe, and notice which parts of your body are tense. Try to relax the muscles in those areas.  Download or buy apps on your mobile phone or tablet that can help you stick to your quit plan. There are many free apps, such as QuitGuide from the [employer] developer for Disease Control and Prevention). You can find more support from smokefree.gov and other websites.  This information is not intended to replace advice given to you by your health care provider. Make sure you discuss any questions you have with your health care provider. Document Released: 03/27/2009 Document Revised: 01/27/2016 Document Reviewed: 10/15/2014 Elsevier Interactive Patient Education  2018 [employer].  Coping with Quitting Smoking Quitting smoking is a physical and mental challenge. You will face cravings, withdrawal symptoms, and temptation. Before quitting, work with your health care provider to make a plan that can help you cope. Preparation can help you quit and keep you from giving in. How can I cope with cravings? Cravings usually last for 5-10 minutes. If you get through it, the craving will pass. Consider taking the following actions to help you cope with cravings:  Keep your mouth busy: ? Chew sugar-free gum. ? Suck on  hard candies or a straw. ? Brush your teeth.  Keep your hands and body busy: ? Immediately change to a different activity when you feel a craving. ? Squeeze or play with a ball. ? Do an activity or a hobby, like making bead jewelry, practicing needlepoint, or working with wood. ? Mix up your normal routine. ? Take a short exercise break. Go for a quick walk or run up and down stairs. ? Spend time in public places where  smoking is not allowed.  Focus on doing something kind or helpful for someone else.  Call a friend or family member to talk during a craving.  Join a support group.  Call a quit line, such as 1-800-QUIT-NOW.  Talk with your health care provider about medicines that might help you cope with cravings and make quitting easier for you.  How can I deal with withdrawal symptoms? Your body may experience negative effects as it tries to get used to not having nicotine in the system. These effects are called withdrawal symptoms. They may include:  Feeling hungrier than normal.  Trouble concentrating.  Irritability.  Trouble sleeping.  Feeling depressed.  Restlessness and agitation.  Craving a cigarette.  To manage withdrawal symptoms:  Avoid places, people, and activities that trigger your cravings.  Remember why you want to quit.  Get plenty of sleep.  Avoid coffee and other caffeinated drinks. These may worsen some of your symptoms.  How can I handle social situations? Social situations can be difficult when you are quitting smoking, especially in the first few weeks. To manage this, you can:  Avoid parties, bars, and other social situations where people might be smoking.  Avoid alcohol.  Leave right away if you have the urge to smoke.  Explain to your family and friends that you are quitting smoking. Ask for understanding and support.  Plan activities with friends or family where smoking is not an option.  What are some ways I can cope with stress? Wanting to smoke may cause stress, and stress can make you want to smoke. Find ways to manage your stress. Relaxation techniques can help. For example:  Breathe slowly and deeply, in through your nose and out through your mouth.  Listen to soothing, relaxing music.  Talk with a family member or friend about your stress.  Light a candle.  Soak in a bath or take a shower.  Think about a peaceful place.  What are  some ways I can prevent weight gain? Be aware that many people gain weight after they quit smoking. However, not everyone does. To keep from gaining weight, have a plan in place before you quit and stick to the plan after you quit. Your plan should include:  Having healthy snacks. When you have a craving, it may help to: ? Eat plain popcorn, crunchy carrots, celery, or other cut vegetables. ? Chew sugar-free gum.  Changing how you eat: ? Eat small portion sizes at meals. ? Eat 4-6 small meals throughout the day instead of 1-2 large meals a day. ? Be mindful when you eat. Do not watch television or do other things that might distract you as you eat.  Exercising regularly: ? Make time to exercise each day. If you do not have time for a long workout, do short bouts of exercise for 5-10 minutes several times a day. ? Do some form of strengthening exercise, like weight lifting, and some form of aerobic exercise, like running or swimming.  Drinking plenty of  water or other low-calorie or no-calorie drinks. Drink 6-8 glasses of water daily, or as much as instructed by your health care provider.  Summary  Quitting smoking is a physical and mental challenge. You will face cravings, withdrawal symptoms, and temptation to smoke again. Preparation can help you as you go through these challenges.  You can cope with cravings by keeping your mouth busy (such as by chewing gum), keeping your body and hands busy, and making calls to family, friends, or a helpline for people who want to quit smoking.  You can cope with withdrawal symptoms by avoiding places where people smoke, avoiding drinks with caffeine, and getting plenty of rest.  Ask your health care provider about the different ways to prevent weight gain, avoid stress, and handle social situations. This information is not intended to replace advice given to you by your health care provider. Make sure you discuss any questions you have with your  health care provider. Document Released: 05/28/2016 Document Revised: 05/28/2016 Document Reviewed: 05/28/2016 Elsevier Interactive Patient Education  [address].     Signed, Jacolyn Reedy, [place]  10/12/2017 12:22 PM    [facility] [address] [place], [place], [place]  [address] Phone: [phone]; Fax: [phone]

## 2017-10-12 NOTE — Patient Instructions (Addendum)
Medication Instructions:  Your physician recommends that you continue on your current medications as directed. Please refer to the Current Medication list given to you today.  Labwork: Your physician recommends that you return for lab work in: 6 weeks for fasting lipid and liver panel.  Testing/Procedures: NONE  Follow-Up: Your physician wants you to follow-up next available with Dr. Delton See .     If you need a refill on your cardiac medications before your next appointment, please call your pharmacy.    Steps to Quit Smoking Smoking tobacco can be bad for your health. It can also affect almost every organ in your body. Smoking puts you and people around you at risk for many serious long-lasting (chronic) diseases. Quitting smoking is hard, but it is one of the best things that you can do for your health. It is never too late to quit. What are the benefits of quitting smoking? When you quit smoking, you lower your risk for getting serious diseases and conditions. They can include:  Lung cancer or lung disease.  Heart disease.  Stroke.  Heart attack.  Not being able to have children (infertility).  Weak bones (osteoporosis) and broken bones (fractures).  If you have coughing, wheezing, and shortness of breath, those symptoms may get better when you quit. You may also get sick less often. If you are pregnant, quitting smoking can help to lower your chances of having a baby of low birth weight. What can I do to help me quit smoking? Talk with your doctor about what can help you quit smoking. Some things you can do (strategies) include:  Quitting smoking totally, instead of slowly cutting back how much you smoke over a period of time.  Going to in-person counseling. You are more likely to quit if you go to many counseling sessions.  Using resources and support systems, such as: ? Agricultural engineer with a Veterinary surgeon. ? Phone quitlines. ? Automotive engineer. ? Support  groups or group counseling. ? Text messaging programs. ? Mobile phone apps or applications.  Taking medicines. Some of these medicines may have nicotine in them. If you are pregnant or breastfeeding, do not take any medicines to quit smoking unless your doctor says it is okay. Talk with your doctor about counseling or other things that can help you.  Talk with your doctor about using more than one strategy at the same time, such as taking medicines while you are also going to in-person counseling. This can help make quitting easier. What things can I do to make it easier to quit? Quitting smoking might feel very hard at first, but there is a lot that you can do to make it easier. Take these steps:  Talk to your family and friends. Ask them to support and encourage you.  Call phone quitlines, reach out to support groups, or work with a Veterinary surgeon.  Ask people who smoke to not smoke around you.  Avoid places that make you want (trigger) to smoke, such as: ? Bars. ? Parties. ? Smoke-break areas at work.  Spend time with people who do not smoke.  Lower the stress in your life. Stress can make you want to smoke. Try these things to help your stress: ? Getting regular exercise. ? Deep-breathing exercises. ? Yoga. ? Meditating. ? Doing a body scan. To do this, close your eyes, focus on one area of your body at a time from head to toe, and notice which parts of your body are tense. Try to  relax the muscles in those areas.  Download or buy apps on your mobile phone or tablet that can help you stick to your quit plan. There are many free apps, such as QuitGuide from the [employer] developer for Disease Control and Prevention). You can find more support from smokefree.gov and other websites.  This information is not intended to replace advice given to you by your health care provider. Make sure you discuss any questions you have with your health care provider. Document Released: 03/27/2009 Document  Revised: 01/27/2016 Document Reviewed: 10/15/2014 Elsevier Interactive Patient Education  2018 [employer].  Coping with Quitting Smoking Quitting smoking is a physical and mental challenge. You will face cravings, withdrawal symptoms, and temptation. Before quitting, work with your health care provider to make a plan that can help you cope. Preparation can help you quit and keep you from giving in. How can I cope with cravings? Cravings usually last for 5-10 minutes. If you get through it, the craving will pass. Consider taking the following actions to help you cope with cravings:  Keep your mouth busy: ? Chew sugar-free gum. ? Suck on hard candies or a straw. ? Brush your teeth.  Keep your hands and body busy: ? Immediately change to a different activity when you feel a craving. ? Squeeze or play with a ball. ? Do an activity or a hobby, like making bead jewelry, practicing needlepoint, or working with wood. ? Mix up your normal routine. ? Take a short exercise break. Go for a quick walk or run up and down stairs. ? Spend time in public places where smoking is not allowed.  Focus on doing something kind or helpful for someone else.  Call a friend or family member to talk during a craving.  Join a support group.  Call a quit line, such as 1-800-QUIT-NOW.  Talk with your health care provider about medicines that might help you cope with cravings and make quitting easier for you.  How can I deal with withdrawal symptoms? Your body may experience negative effects as it tries to get used to not having nicotine in the system. These effects are called withdrawal symptoms. They may include:  Feeling hungrier than normal.  Trouble concentrating.  Irritability.  Trouble sleeping.  Feeling depressed.  Restlessness and agitation.  Craving a cigarette.  To manage withdrawal symptoms:  Avoid places, people, and activities that trigger your cravings.  Remember why you want to  quit.  Get plenty of sleep.  Avoid coffee and other caffeinated drinks. These may worsen some of your symptoms.  How can I handle social situations? Social situations can be difficult when you are quitting smoking, especially in the first few weeks. To manage this, you can:  Avoid parties, bars, and other social situations where people might be smoking.  Avoid alcohol.  Leave right away if you have the urge to smoke.  Explain to your family and friends that you are quitting smoking. Ask for understanding and support.  Plan activities with friends or family where smoking is not an option.  What are some ways I can cope with stress? Wanting to smoke may cause stress, and stress can make you want to smoke. Find ways to manage your stress. Relaxation techniques can help. For example:  Breathe slowly and deeply, in through your nose and out through your mouth.  Listen to soothing, relaxing music.  Talk with a family member or friend about your stress.  Light a candle.  Soak in a  bath or take a shower.  Think about a peaceful place.  What are some ways I can prevent weight gain? Be aware that many people gain weight after they quit smoking. However, not everyone does. To keep from gaining weight, have a plan in place before you quit and stick to the plan after you quit. Your plan should include:  Having healthy snacks. When you have a craving, it may help to: ? Eat plain popcorn, crunchy carrots, celery, or other cut vegetables. ? Chew sugar-free gum.  Changing how you eat: ? Eat small portion sizes at meals. ? Eat 4-6 small meals throughout the day instead of 1-2 large meals a day. ? Be mindful when you eat. Do not watch television or do other things that might distract you as you eat.  Exercising regularly: ? Make time to exercise each day. If you do not have time for a long workout, do short bouts of exercise for 5-10 minutes several times a day. ? Do some form of  strengthening exercise, like weight lifting, and some form of aerobic exercise, like running or swimming.  Drinking plenty of water or other low-calorie or no-calorie drinks. Drink 6-8 glasses of water daily, or as much as instructed by your health care provider.  Summary  Quitting smoking is a physical and mental challenge. You will face cravings, withdrawal symptoms, and temptation to smoke again. Preparation can help you as you go through these challenges.  You can cope with cravings by keeping your mouth busy (such as by chewing gum), keeping your body and hands busy, and making calls to family, friends, or a helpline for people who want to quit smoking.  You can cope with withdrawal symptoms by avoiding places where people smoke, avoiding drinks with caffeine, and getting plenty of rest.  Ask your health care provider about the different ways to prevent weight gain, avoid stress, and handle social situations. This information is not intended to replace advice given to you by your health care provider. Make sure you discuss any questions you have with your health care provider. Document Released: 05/28/2016 Document Revised: 05/28/2016 Document Reviewed: 05/28/2016 Elsevier Interactive Patient Education  [employer].

## 2017-11-23 ENCOUNTER — Other Ambulatory Visit: Payer: Medicare Other | Admitting: *Deleted

## 2017-11-23 DIAGNOSIS — Z8249 Family history of ischemic heart disease and other diseases of the circulatory system: Secondary | ICD-10-CM

## 2017-11-23 DIAGNOSIS — E782 Mixed hyperlipidemia: Secondary | ICD-10-CM

## 2017-11-23 DIAGNOSIS — I214 Non-ST elevation (NSTEMI) myocardial infarction: Secondary | ICD-10-CM

## 2017-11-23 DIAGNOSIS — F17200 Nicotine dependence, unspecified, uncomplicated: Secondary | ICD-10-CM

## 2017-11-23 DIAGNOSIS — I10 Essential (primary) hypertension: Secondary | ICD-10-CM

## 2017-11-23 LAB — HEPATIC FUNCTION PANEL
ALBUMIN: 4.3 g/dL (ref 3.6–4.8)
ALK PHOS: 68 IU/L (ref 39–117)
ALT: 26 IU/L (ref 0–32)
AST: 20 IU/L (ref 0–40)
BILIRUBIN, DIRECT: 0.1 mg/dL (ref 0.00–0.40)
Bilirubin Total: 0.3 mg/dL (ref 0.0–1.2)
Total Protein: 6.8 g/dL (ref 6.0–8.5)

## 2017-11-23 LAB — LIPID PANEL
CHOL/HDL RATIO: 4 ratio (ref 0.0–4.4)
CHOLESTEROL TOTAL: 145 mg/dL (ref 100–199)
HDL: 36 mg/dL — ABNORMAL LOW (ref >39)
LDL Calculated: 84 mg/dL (ref 0–99)
TRIGLYCERIDES: 126 mg/dL (ref 0–149)
VLDL Cholesterol Cal: 25 mg/dL (ref 5–40)

## 2017-11-24 ENCOUNTER — Telehealth: Payer: Self-pay | Admitting: Physician Assistant

## 2017-11-24 DIAGNOSIS — E782 Mixed hyperlipidemia: Secondary | ICD-10-CM

## 2017-11-24 NOTE — Telephone Encounter (Signed)
New message ° ° ° °Pt is returning call about labs.  °

## 2017-11-24 NOTE — Telephone Encounter (Signed)
-----   Message from Dyann KiefMichele M Lenze, PA-C sent at 11/24/2017  7:29 AM EDT ----- Lipid profile much better. LDL went from 145 down to 84! Goal is 70 so we should recheck lipids in another 3 months. Triglycerides also normalized and good cholesterol went up!

## 2017-11-24 NOTE — Telephone Encounter (Signed)
Pt has been notified of lab results by phone with verbal understanding. Pt agreeable to repeat Lipids in 3 months, will have labs done 9/6/ when she see's Dr. Delton SeeNelson. Pt thanked me for the call.

## 2018-02-17 ENCOUNTER — Ambulatory Visit (INDEPENDENT_AMBULATORY_CARE_PROVIDER_SITE_OTHER): Payer: Medicare Other | Admitting: Cardiology

## 2018-02-17 ENCOUNTER — Encounter: Payer: Self-pay | Admitting: Cardiology

## 2018-02-17 VITALS — BP 106/80 | HR 71 | Ht 59.0 in | Wt 204.2 lb

## 2018-02-17 DIAGNOSIS — I5033 Acute on chronic diastolic (congestive) heart failure: Secondary | ICD-10-CM

## 2018-02-17 DIAGNOSIS — I214 Non-ST elevation (NSTEMI) myocardial infarction: Secondary | ICD-10-CM

## 2018-02-17 DIAGNOSIS — E782 Mixed hyperlipidemia: Secondary | ICD-10-CM

## 2018-02-17 DIAGNOSIS — I10 Essential (primary) hypertension: Secondary | ICD-10-CM

## 2018-02-17 DIAGNOSIS — Z8249 Family history of ischemic heart disease and other diseases of the circulatory system: Secondary | ICD-10-CM | POA: Diagnosis not present

## 2018-02-17 LAB — COMPREHENSIVE METABOLIC PANEL
ALT: 19 IU/L (ref 0–32)
AST: 19 IU/L (ref 0–40)
Albumin/Globulin Ratio: 1.7 (ref 1.2–2.2)
Albumin: 4.4 g/dL (ref 3.6–4.8)
Alkaline Phosphatase: 73 IU/L (ref 39–117)
BUN/Creatinine Ratio: 19 (ref 12–28)
BUN: 17 mg/dL (ref 8–27)
Bilirubin Total: 0.4 mg/dL (ref 0.0–1.2)
CO2: 18 mmol/L — ABNORMAL LOW (ref 20–29)
Calcium: 9.4 mg/dL (ref 8.7–10.3)
Chloride: 103 mmol/L (ref 96–106)
Creatinine, Ser: 0.9 mg/dL (ref 0.57–1.00)
GFR calc Af Amer: 77 mL/min/{1.73_m2} (ref >59)
GFR calc non Af Amer: 67 mL/min/{1.73_m2} (ref >59)
Globulin, Total: 2.6 g/dL (ref 1.5–4.5)
Glucose: 103 mg/dL — ABNORMAL HIGH (ref 65–99)
Potassium: 4.3 mmol/L (ref 3.5–5.2)
Sodium: 137 mmol/L (ref 134–144)
Total Protein: 7 g/dL (ref 6.0–8.5)

## 2018-02-17 LAB — PRO B NATRIURETIC PEPTIDE: NT-Pro BNP: 389 pg/mL — ABNORMAL HIGH (ref 0–301)

## 2018-02-17 MED ORDER — SPIRONOLACTONE 25 MG PO TABS: 12.5000 mg | ORAL_TABLET | Freq: Every day | ORAL | 2 refills | Status: DC

## 2018-02-17 MED ORDER — LISINOPRIL 5 MG PO TABS: 5.0000 mg | ORAL_TABLET | Freq: Every day | ORAL | 2 refills | Status: DC

## 2018-02-17 NOTE — Progress Notes (Signed)
Cardiology Office Note    Date:  02/17/2018   ID:  Jenna [name], DOB 06-24-1950, MRN [id number]  PCP:  Ward, Clois Comber, FNP  Cardiologist: Tobias Alexander, MD  No chief complaint on file.   History of Present Illness:  Jenna [name] is a 67 y.o. female with history of CAD status post NSTEMI 09/27/2017 with cardiac cath showing moderate nonobstructive CAD recommend aggressive medical therapy.  50% ostial ramus, 60% ramus, 30% proximal RCA, 45% proximal to mid RCA, 20% mid RCA normal LVEF 60 to 65%.  Smoking cessation recommended and hypodensity statin.  Patient also has hypertension, HLD.  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  She continues to smoke, she is not motivated to quit, she previously tried Chantix but she had hallucination from it and nicotine patches did not help.   She has been compliant with her medications and tolerating them well, she has been coughing at night and has mild lower extremity edema.    Past Medical History:  Diagnosis Date  . Anxiety   . Arthritis   . Chest pain 09/2017  . COPD (chronic obstructive pulmonary disease) (HCC)   . Depression   . HTN (hypertension)   . Obesity, Class III, BMI 40-49.9 (morbid obesity) (HCC)   . Sleep apnea    history of sleep apnea  . Smoking    Past Surgical History:  Procedure Laterality Date  . ABDOMINAL HYSTERECTOMY    . BREAST LUMPECTOMY    . CARPAL TUNNEL RELEASE     bilteral  . CERVICAL DISC SURGERY    . COLONOSCOPY  07/2017  . HEMORROIDECTOMY    . LEFT HEART CATH AND CORONARY ANGIOGRAPHY N/A 09/27/2017   Procedure: LEFT HEART CATH AND CORONARY ANGIOGRAPHY;  Surgeon: Lennette Bihari, MD;  Location: [facility];  Service: Cardiovascular;  Laterality: N/A;  . TONSILLECTOMY     Current Medications: Current Meds  Medication Sig  . aspirin 81 MG chewable tablet Chew 1  tablet (81 mg total) by mouth daily.  Marland Kitchen atorvastatin (LIPITOR) 80 MG tablet Take 1 tablet (80 mg total) by mouth daily at 6 PM.  . cetirizine (ZYRTEC) 10 MG tablet Take 10 mg by mouth every morning.  . metoprolol tartrate (LOPRESSOR) 50 MG tablet Take 50 mg by mouth 2 (two) times daily.  Marland Kitchen oxybutynin (DITROPAN) 5 MG tablet Take 5 mg by mouth every morning.  Marland Kitchen PARoxetine (PAXIL) 20 MG tablet Take 40 mg by mouth daily.  . [DISCONTINUED] lisinopril (PRINIVIL,ZESTRIL) 10 MG tablet Take 10 mg by mouth daily.     Allergies:   Arlice Colt isothiocyanate] and Hctz [hydrochlorothiazide]   Social History   Socioeconomic History  . Marital status: Divorced    Spouse name: Not on file  . Number of children: Not on file  . Years of education: Not on file  . Highest education level: Not on file  Occupational History  . Not on file  Social Needs  . Financial resource strain: Not on file  . Food insecurity:    Worry: Not on file    Inability: Not on file  . Transportation needs:    Medical: Not on file    Non-medical: Not on file  Tobacco Use  . Smoking status: Current Every Day Smoker    Packs/day: 1.00    Types: Cigarettes  .  Smokeless tobacco: Never Used  . Tobacco comment: 1PPD since age 31  Substance and Sexual Activity  . Alcohol use: Yes    Comment: rare  . Drug use: Not Currently  . Sexual activity: Not on file  Lifestyle  . Physical activity:    Days per week: Not on file    Minutes per session: Not on file  . Stress: Not on file  Relationships  . Social connections:    Talks on phone: Not on file    Gets together: Not on file    Attends religious service: Not on file    Active member of club or organization: Not on file    Attends meetings of clubs or organizations: Not on file    Relationship status: Not on file  Other Topics Concern  . Not on file  Social History Narrative  . Not on file     Family History:  The patient's family history includes CAD (age of  onset: 94) in her son; Heart attack (age of onset: 65) in her father; Heart attack (age of onset: 63) in her sister.   ROS:   Please see the history of present illness.    Review of Systems  Constitution: Negative.  HENT: Negative.   Eyes: Negative.   Cardiovascular: Positive for dyspnea on exertion.  Respiratory: Positive for cough.   Hematologic/Lymphatic: Negative.   Musculoskeletal: Negative.  Negative for joint pain.  Gastrointestinal: Negative.   Genitourinary: Negative.   Neurological: Negative.   All other systems reviewed and are negative.  PHYSICAL EXAM:   VS:  BP 106/80   Pulse 71   Ht 4\' 11"  (1.499 m)   Wt 204 lb 3.2 oz (92.6 kg)   LMP  (LMP Unknown)   SpO2 96%   BMI 41.24 kg/m   Physical Exam  GEN: Obese, in no acute distress  Neck: no JVD, carotid bruits, or masses Cardiac:RRR; no murmurs, rubs, or gallops  Respiratory:  clear to auscultation bilaterally, normal work of breathing GI: soft, nontender, nondistended, + BS Ext: Right groin at cath site without hematoma or hemorrhage otherwise lower extremities without cyanosis, clubbing, or edema, Good distal pulses bilaterally Neuro:  Alert and Oriented x 3 Psych: euthymic mood, full affect  Wt Readings from Last 3 Encounters:  02/17/18 204 lb 3.2 oz (92.6 kg)  10/12/17 205 lb (93 kg)  09/28/17 200 lb 9.9 oz (91 kg)      Studies/Labs Reviewed:   EKG:  EKG is not ordered today.   Recent Labs: 09/28/2017: BUN 10; Creatinine, Ser 0.86; Hemoglobin 15.5; Platelets 145; Potassium 4.1; Sodium 135 11/23/2017: ALT 26   Lipid Panel    Component Value Date/Time   CHOL 145 11/23/2017 1009   TRIG 126 11/23/2017 1009   HDL 36 (L) 11/23/2017 1009   CHOLHDL 4.0 11/23/2017 1009   CHOLHDL 8.1 09/27/2017 0358   VLDL 39 09/27/2017 0358   LDLCALC 84 11/23/2017 1009   Additional studies/ records that were reviewed today include:  Echo 4/17/19Study Conclusions   - Left ventricle: The cavity size was normal. Wall  thickness was   normal. Systolic function was normal. The estimated ejection   fraction was in the range of 55% to 60%. Wall motion was normal;   there were no regional wall motion abnormalities. Features are   consistent with a pseudonormal left ventricular filling pattern,   with concomitant abnormal relaxation and increased filling   pressure (grade 2 diastolic dysfunction). - Aortic valve: There was mild  regurgitation. - Mitral valve: Moderately calcified annulus. There was mild   regurgitation. - Left atrium: The atrium was mildly dilated. - Pulmonary arteries: Systolic pressure was mildly increased. PA   peak pressure: 41 mm Hg (S).  Cardiac cath 4/16/19Conclusion      Ost Ramus lesion is 50% stenosed.  Ramus lesion is 60% stenosed.  Prox RCA lesion is 30% stenosed.  Prox RCA to Mid RCA lesion is 45% stenosed.  Mid RCA lesion is 20% stenosed.  LV end diastolic pressure is normal.  The left ventricular systolic function is normal.   Preserved global LV function with an ejection fraction of 60-65% and a small region of mid distal inferior hypocontractility.   Evidence for coronary calcification involving the left main, LAD and RCA.   Mild 10-20% focal narrowing at the ostium of the first diagonal branch of the LAD; 50-60% stenosis in a small caliber ramus intermediate vessel, less than 2 mm in diameter; normal left circumflex Artery; dominant RCA with mild calcification and 30% proximal 40-50% mid 20% stenosis proximal to the acute margin.   RECOMMENDATION: Medical therapy will be initiated.  Smoking cessation is essential.  High potency statin therapy in attempt to induce plaque regression.      ASSESSMENT:    1. Mixed hyperlipidemia   2. Essential hypertension   3. NSTEMI (non-ST elevated myocardial infarction) (HCC)   4. Family history of early CAD   5. Acute on chronic diastolic CHF (congestive heart failure) (HCC)    PLAN:  In order of problems listed  above:  NSTEMI with moderate nonobstructive CAD on cath.  No recurrent angina.  Continue aspirin Lipitor and metoprolol.   Acute diastolic CHF with mild crackles in her lung bases and mild lower extremity edema.  I will add spironolactone 12.5 mg daily obtain CMP and BNP today.  Essential hypertension controlled, will decrease lisinopril to 5 mg daily in order to be able to add spironolactone.  Mixed hyperlipidemia on Lipitor.  LDL improved from 1 45-84 in June 2019, triglycerides normal.  Family history of early CAD risk factor modification essential.  Smoking history- I had a long discussion about smoking cessation with the patient.  Patient said she will cut back but has no intention of quitting.  Offered her smoking cessation courses or office visit but she declined.  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  Medication Adjustments/Labs and Tests Ordered: Current medicines are reviewed at length with the patient today.  Concerns regarding medicines are outlined above.  Medication changes, Labs and Tests ordered today are listed in the Patient Instructions below. Patient Instructions  Medication Instructions:   DECREASE LISINOPRIL TO 5 MG ONCE DAILY  START TAKING SPIRONOLACTONE 12.5 MG ONCE DAILY    Labwork:  TODAY--CMET AND PRO-BNP     Follow-Up:  2 MONTHS WITH MICHELLE LENZE PA-C       If you need a refill on your cardiac medications before your next appointment, please call your pharmacy.      Signed, Tobias Alexander, MD  02/17/2018 9:00 AM    [facility] [address] [place], [place], [place]  [address] Phone: [phone]; Fax: [phone]

## 2018-02-17 NOTE — Patient Instructions (Signed)
Medication Instructions:   DECREASE LISINOPRIL TO 5 MG ONCE DAILY  START TAKING SPIRONOLACTONE 12.5 MG ONCE DAILY    Labwork:  TODAY--CMET AND PRO-BNP     Follow-Up:  2 MONTHS WITH MICHELLE LENZE PA-C       If you need a refill on your cardiac medications before your next appointment, please call your pharmacy.

## 2018-03-31 ENCOUNTER — Encounter: Payer: Self-pay | Admitting: Physician Assistant

## 2018-04-18 DIAGNOSIS — I5032 Chronic diastolic (congestive) heart failure: Secondary | ICD-10-CM | POA: Insufficient documentation

## 2018-04-18 DIAGNOSIS — I2510 Atherosclerotic heart disease of native coronary artery without angina pectoris: Secondary | ICD-10-CM | POA: Insufficient documentation

## 2018-04-18 NOTE — Progress Notes (Signed)
Cardiology Office Note    Date:  04/19/2018   ID:  Jenna [name], DOB Jan 24, 1951, MRN [id number]  PCP:  Ward, Carlyon Shadow, FNP  Cardiologist: Ena Dawley, MD EPS: None  No chief complaint on file.   History of Present Illness:  Jenna [name] is a 67 y.o. female with history of CAD status post NSTEMI 09/27/2017 with cardiac cath showing moderate nonobstructive CAD recommend aggressive medical therapy.  50% ostial ramus, 60% ramus, 30% proximal RCA, 45% proximal to mid RCA, 20% mid RCA normal LVEF 60 to 65%.  Smoking cessation recommended and hypodensity statin.  Patient also has hypertension, HLD.   I saw the patient back in May 2019 and she continued to smoke and had cut back but was not willing to quit.  Patient saw Dr. Meda Coffee 02/17/2018 at which time she was felt to have acute diastolic CHF with mild crackles in her lung bases and lower extremity edema.  She was started on Spironolactone 12.5 mg daily.  Lisinopril was decreased to 5 mg once daily.  BNP was elevated that day 389.  Other labs stable.  Patient comes in today for f/u. Had URI in Oct and treated with antibiotics. Just recovering from that. Has lost 16 lbs with diet-portion control. No chest pain. Chronic dyspnea on exertion.  Edema down.  Smoking 1ppd and unwilling to quit.Complains of leg cramps at night and then seems sore all day.  Has had in the past which improved with increasing potassium in her diet.  Pharmacist told her not to take multivitamin with potassium because she is on lisinopril and Aldactone.   Past Medical History:  Diagnosis Date  . Anxiety   . Arthritis   . Chest pain 09/2017  . COPD (chronic obstructive pulmonary disease) ([place])   . Depression   . HTN (hypertension)   . Obesity, Class III, BMI 40-49.9 (morbid obesity) ([place])   . Sleep apnea    history of sleep apnea  . Smoking     Past Surgical History:  Procedure Laterality Date  . ABDOMINAL HYSTERECTOMY    . BREAST LUMPECTOMY    .  CARPAL TUNNEL RELEASE     bilteral  . CERVICAL DISC SURGERY    . COLONOSCOPY  07/2017  . HEMORROIDECTOMY    . LEFT HEART CATH AND CORONARY ANGIOGRAPHY N/A 09/27/2017   Procedure: LEFT HEART CATH AND CORONARY ANGIOGRAPHY;  Surgeon: Troy Sine, MD;  Location: [facility];  Service: Cardiovascular;  Laterality: N/A;  . TONSILLECTOMY      Current Medications: Current Meds  Medication Sig  . albuterol (PROVENTIL HFA;VENTOLIN HFA) 108 (90 Base) MCG/ACT inhaler Inhale 2 puffs into the lungs as needed.  Marland Kitchen aspirin 81 MG chewable tablet Chew 1 tablet (81 mg total) by mouth daily.  Marland Kitchen atorvastatin (LIPITOR) 80 MG tablet Take 1 tablet (80 mg total) by mouth daily at 6 PM.  . cetirizine (ZYRTEC) 10 MG tablet Take 10 mg by mouth every morning.  . Fluticasone-Salmeterol (ADVAIR) 500-50 MCG/DOSE AEPB Inhale 1 puff into the lungs 2 (two) times daily.  Marland Kitchen lisinopril (PRINIVIL,ZESTRIL) 5 MG tablet Take 1 tablet (5 mg total) by mouth daily.  . metoprolol tartrate (LOPRESSOR) 50 MG tablet Take 50 mg by mouth 2 (two) times daily.  Marland Kitchen oxybutynin (DITROPAN) 5 MG tablet Take 5 mg by mouth every morning.  Marland Kitchen PARoxetine (PAXIL) 20 MG tablet Take 40 mg by mouth daily.  Marland Kitchen spironolactone (ALDACTONE) 25 MG tablet Take 0.5 tablets (12.5  mg total) by mouth daily.  Marland Kitchen venlafaxine (EFFEXOR) 37.5 MG tablet Take 37.5 mg by mouth daily.     Allergies:   Madelaine Bhat isothiocyanate] and Hydrochlorothiazide   Social History   Socioeconomic History  . Marital status: Divorced    Spouse name: Not on file  . Number of children: Not on file  . Years of education: Not on file  . Highest education level: Not on file  Occupational History  . Not on file  Social Needs  . Financial resource strain: Not on file  . Food insecurity:    Worry: Not on file    Inability: Not on file  . Transportation needs:    Medical: Not on file    Non-medical: Not on file  Tobacco Use  . Smoking status: Current Every Day Smoker      Packs/day: 1.00    Types: Cigarettes  . Smokeless tobacco: Never Used  . Tobacco comment: 1PPD since age 46  Substance and Sexual Activity  . Alcohol use: Yes    Comment: rare  . Drug use: Not Currently  . Sexual activity: Not on file  Lifestyle  . Physical activity:    Days per week: Not on file    Minutes per session: Not on file  . Stress: Not on file  Relationships  . Social connections:    Talks on phone: Not on file    Gets together: Not on file    Attends religious service: Not on file    Active member of club or organization: Not on file    Attends meetings of clubs or organizations: Not on file    Relationship status: Not on file  Other Topics Concern  . Not on file  Social History Narrative  . Not on file     Family History:  The patient's family history includes CAD (age of onset: 75) in her son; Heart attack (age of onset: 70) in her father; Heart attack (age of onset: 77) in her sister.   ROS:   Please see the history of present illness.    Review of Systems  Constitution: Negative.  HENT: Negative.   Eyes: Negative.   Cardiovascular: Negative.   Respiratory: Positive for cough.   Hematologic/Lymphatic: Negative.   Musculoskeletal: Negative.  Negative for joint pain.  Gastrointestinal: Positive for diarrhea.  Genitourinary: Negative.   Neurological: Negative.   Psychiatric/Behavioral: Positive for depression. The patient is nervous/anxious.    All other systems reviewed and are negative.   PHYSICAL EXAM:   VS:  BP 122/60   Pulse 62   Ht 4' 11" (1.499 m)   Wt 194 lb (88 kg)   LMP  (LMP Unknown)   SpO2 97%   BMI 39.18 kg/m   Physical Exam  GEN: Well nourished, well developed, in no acute distress  Neck: no JVD, carotid bruits, or masses Cardiac:RRR; no murmurs, rubs, or gallops  Respiratory: Decreased breath sounds with fine crackles at the left lung base otherwise clear GI: soft, nontender, nondistended, + BS Ext: without cyanosis,  clubbing, or edema, Good distal pulses bilaterally Neuro:  Alert and Oriented x 3 Psych: euthymic mood, full affect  Wt Readings from Last 3 Encounters:  04/19/18 194 lb (88 kg)  02/17/18 204 lb 3.2 oz (92.6 kg)  10/12/17 205 lb (93 kg)      Studies/Labs Reviewed:   EKG:  EKG is not ordered today.   Recent Labs: 09/28/2017: Hemoglobin 15.5; Platelets 145 02/17/2018: ALT 19; BUN 17;  Creatinine, Ser 0.90; NT-Pro BNP 389; Potassium 4.3; Sodium 137   Lipid Panel    Component Value Date/Time   CHOL 145 11/23/2017 1009   TRIG 126 11/23/2017 1009   HDL 36 (L) 11/23/2017 1009   CHOLHDL 4.0 11/23/2017 1009   CHOLHDL 8.1 09/27/2017 0358   VLDL 39 09/27/2017 0358   LDLCALC 84 11/23/2017 1009    Additional studies/ records that were reviewed today include:   Cardiac catheterization 09/27/2017 Conclusion      Ost Ramus lesion is 50% stenosed.  Ramus lesion is 60% stenosed.  Prox RCA lesion is 30% stenosed.  Prox RCA to Mid RCA lesion is 45% stenosed.  Mid RCA lesion is 20% stenosed.  LV end diastolic pressure is normal.  The left ventricular systolic function is normal.   Preserved global LV function with an ejection fraction of 60-65% and a small region of mid distal inferior hypocontractility.   Evidence for coronary calcification involving the left main, LAD and RCA.   Mild 10-20% focal narrowing at the ostium of the first diagonal branch of the LAD; 50-60% stenosis in a small caliber ramus intermediate vessel, less than 2 mm in diameter; normal left circumflex Artery; dominant RCA with mild calcification and 30% proximal 40-50% mid 20% stenosis proximal to the acute margin.   RECOMMENDATION: Medical therapy will be initiated.  Smoking cessation is essential.  High potency statin therapy in attempt to induce plaque regression.     2D echo 09/28/2017 Study Conclusions   - Left ventricle: The cavity size was normal. Wall thickness was   normal. Systolic function was  normal. The estimated ejection   fraction was in the range of 55% to 60%. Wall motion was normal;   there were no regional wall motion abnormalities. Features are   consistent with a pseudonormal left ventricular filling pattern,   with concomitant abnormal relaxation and increased filling   pressure (grade 2 diastolic dysfunction). - Aortic valve: There was mild regurgitation. - Mitral valve: Moderately calcified annulus. There was mild   regurgitation. - Left atrium: The atrium was mildly dilated. - Pulmonary arteries: Systolic pressure was mildly increased. PA   peak pressure: 41 mm Hg (S).   ASSESSMENT:    1. Coronary artery disease involving native coronary artery of native heart without angina pectoris   2. Chronic diastolic CHF (congestive heart failure) ([place])   3. Essential hypertension   4. Mixed hyperlipidemia   5. Class 3 severe obesity due to excess calories with serious comorbidity and body mass index (BMI) of 40.0 to 44.9 in adult [facility])   6. Smoking      PLAN:  In order of problems listed above:  CAD status post NSTEMI with moderate nonobstructive CAD on cath 09/2017 without angina  Chronic diastolic CHF with grade 2 DD on echo 09/2017 recently started on spironolactone 02/2018 with elevated BNP over 300.  Patient has lost 10 pounds since last office visit but she has been dieting.  Not sure how much of this was fluid.  Will check BNP, be met and magnesium because of leg cramps  Essential hypertension lisinopril decreased to 5 mg daily once spironolactone was added.  Blood pressure well controlled  Mixed hyperlipidemia LDL 84 in June on Lipitor 80 mg  Obesity patient has lost a total of 16 pounds with portion control.  Tobacco abuse patient continues to smoke 1 pack/day but refuses to quit.    Medication Adjustments/Labs and Tests Ordered: Current medicines are reviewed at length with  the patient today.  Concerns regarding medicines are outlined above.   Medication changes, Labs and Tests ordered today are listed in the Patient Instructions below. Patient Instructions  Your physician recommends that you continue on your current medications as directed. Please refer to the Current Medication list given to you today.   Your physician recommends that you return for lab work in: TODAY BMET [place] wants you to follow-up in: [place] will receive a reminder letter in the mail two months in advance. If you don't receive a letter, please call our office to schedule the follow-up appointment.   Your physician discussed the hazards of tobacco use. Tobacco use cessation is recommended and techniques and options to help you quit were discussed.        Sumner Boast, PA-C  04/19/2018 9:01 AM    [facility] [address], [place], [place]  [address] Phone: [phone]; Fax: [phone]

## 2018-04-19 ENCOUNTER — Ambulatory Visit (INDEPENDENT_AMBULATORY_CARE_PROVIDER_SITE_OTHER): Payer: Medicare Other | Admitting: Physician Assistant

## 2018-04-19 ENCOUNTER — Encounter: Payer: Self-pay | Admitting: Physician Assistant

## 2018-04-19 VITALS — BP 122/60 | HR 62 | Ht 59.0 in | Wt 194.0 lb

## 2018-04-19 DIAGNOSIS — E782 Mixed hyperlipidemia: Secondary | ICD-10-CM

## 2018-04-19 DIAGNOSIS — F17200 Nicotine dependence, unspecified, uncomplicated: Secondary | ICD-10-CM

## 2018-04-19 DIAGNOSIS — I10 Essential (primary) hypertension: Secondary | ICD-10-CM

## 2018-04-19 DIAGNOSIS — I5032 Chronic diastolic (congestive) heart failure: Secondary | ICD-10-CM | POA: Diagnosis not present

## 2018-04-19 DIAGNOSIS — Z6841 Body Mass Index (BMI) 40.0 and over, adult: Secondary | ICD-10-CM

## 2018-04-19 DIAGNOSIS — I2510 Atherosclerotic heart disease of native coronary artery without angina pectoris: Secondary | ICD-10-CM

## 2018-04-19 LAB — BASIC METABOLIC PANEL
BUN/Creatinine Ratio: 9 — ABNORMAL LOW (ref 12–28)
BUN: 8 mg/dL (ref 8–27)
CALCIUM: 9.7 mg/dL (ref 8.7–10.3)
CO2: 20 mmol/L (ref 20–29)
CREATININE: 0.91 mg/dL (ref 0.57–1.00)
Chloride: 102 mmol/L (ref 96–106)
GFR, EST AFRICAN AMERICAN: 76 mL/min/{1.73_m2} (ref >59)
GFR, EST NON AFRICAN AMERICAN: 66 mL/min/{1.73_m2} (ref >59)
Glucose: 89 mg/dL (ref 65–99)
Potassium: 4.1 mmol/L (ref 3.5–5.2)
Sodium: 140 mmol/L (ref 134–144)

## 2018-04-19 LAB — MAGNESIUM: Magnesium: 1.9 mg/dL (ref 1.6–2.3)

## 2018-04-19 LAB — PRO B NATRIURETIC PEPTIDE: NT-Pro BNP: 300 pg/mL (ref 0–301)

## 2018-04-19 NOTE — Patient Instructions (Addendum)
Your physician recommends that you continue on your current medications as directed. Please refer to the Current Medication list given to you today.   Your physician recommends that you return for lab work in: TODAY BMET MAG AND BNP  Your physician wants you to follow-up in: 6 MONTHS WITH DR Johnell Comings will receive a reminder letter in the mail two months in advance. If you don't receive a letter, please call our office to schedule the follow-up appointment.   Your physician discussed the hazards of tobacco use. Tobacco use cessation is recommended and techniques and options to help you quit were discussed.

## 2019-12-14 ENCOUNTER — Emergency Department (HOSPITAL_COMMUNITY)
Admission: EM | Admit: 2019-12-14 | Discharge: 2019-12-15 | Disposition: A | Discharge status: Home / Self Care | Payer: Medicare Other | Attending: Emergency Medicine | Admitting: Emergency Medicine

## 2019-12-14 ENCOUNTER — Other Ambulatory Visit: Payer: Self-pay

## 2019-12-14 ENCOUNTER — Encounter (HOSPITAL_COMMUNITY): Payer: Self-pay

## 2019-12-14 DIAGNOSIS — Z79899 Other long term (current) drug therapy: Secondary | ICD-10-CM | POA: Diagnosis not present

## 2019-12-14 DIAGNOSIS — J449 Chronic obstructive pulmonary disease, unspecified: Secondary | ICD-10-CM | POA: Insufficient documentation

## 2019-12-14 DIAGNOSIS — I10 Essential (primary) hypertension: Secondary | ICD-10-CM | POA: Insufficient documentation

## 2019-12-14 DIAGNOSIS — R109 Unspecified abdominal pain: Secondary | ICD-10-CM

## 2019-12-14 DIAGNOSIS — N134 Hydroureter: Secondary | ICD-10-CM | POA: Diagnosis not present

## 2019-12-14 DIAGNOSIS — F17210 Nicotine dependence, cigarettes, uncomplicated: Secondary | ICD-10-CM | POA: Insufficient documentation

## 2019-12-14 DIAGNOSIS — R1030 Lower abdominal pain, unspecified: Secondary | ICD-10-CM | POA: Diagnosis present

## 2019-12-14 DIAGNOSIS — N1330 Unspecified hydronephrosis: Secondary | ICD-10-CM | POA: Diagnosis not present

## 2019-12-14 DIAGNOSIS — N390 Urinary tract infection, site not specified: Secondary | ICD-10-CM

## 2019-12-14 LAB — BASIC METABOLIC PANEL
Anion gap: 12 (ref 5–15)
BUN: 26 mg/dL — ABNORMAL HIGH (ref 8–23)
CO2: 16 mmol/L — ABNORMAL LOW (ref 22–32)
Calcium: 9.3 mg/dL (ref 8.9–10.3)
Chloride: 104 mmol/L (ref 98–111)
Creatinine, Ser: 1.49 mg/dL — ABNORMAL HIGH (ref 0.44–1.00)
GFR calc Af Amer: 41 mL/min — ABNORMAL LOW (ref >60)
GFR calc non Af Amer: 36 mL/min — ABNORMAL LOW (ref >60)
Glucose, Bld: 127 mg/dL — ABNORMAL HIGH (ref 70–99)
Potassium: 4.7 mmol/L (ref 3.5–5.1)
Sodium: 132 mmol/L — ABNORMAL LOW (ref 135–145)

## 2019-12-14 LAB — URINALYSIS, ROUTINE W REFLEX MICROSCOPIC

## 2019-12-14 LAB — CBC
HCT: 44.4 % (ref 36.0–46.0)
Hemoglobin: 14.7 g/dL (ref 12.0–15.0)
MCH: 29.8 pg (ref 26.0–34.0)
MCHC: 33.1 g/dL (ref 30.0–36.0)
MCV: 90.1 fL (ref 80.0–100.0)
Platelets: 353 10*3/uL (ref 150–400)
RBC: 4.93 MIL/uL (ref 3.87–5.11)
RDW: 14.2 % (ref 11.5–15.5)
WBC: 12.8 10*3/uL — ABNORMAL HIGH (ref 4.0–10.5)
nRBC: 0 % (ref 0.0–0.2)

## 2019-12-14 LAB — URINALYSIS, MICROSCOPIC (REFLEX)
RBC / HPF: >50 RBC/hpf (ref 0–5)
WBC, UA: >50 WBC/hpf (ref 0–5)

## 2019-12-14 MED ORDER — ONDANSETRON HCL 4 MG/2ML IJ SOLN
4.0000 mg | Freq: Once | INTRAMUSCULAR | Status: AC
  Administered 2019-12-15: 4 mg via INTRAVENOUS
  Filled 2019-12-14: qty 2

## 2019-12-14 MED ORDER — KETOROLAC TROMETHAMINE 30 MG/ML IJ SOLN
30.0000 mg | Freq: Once | INTRAMUSCULAR | Status: AC
  Administered 2019-12-15: 30 mg via INTRAVENOUS
  Filled 2019-12-14: qty 1

## 2019-12-14 MED ORDER — SODIUM CHLORIDE 0.9 % IV BOLUS (SEPSIS)
1000.0000 mL | Freq: Once | INTRAVENOUS | Status: AC
  Administered 2019-12-15: 1000 mL via INTRAVENOUS

## 2019-12-14 NOTE — ED Provider Notes (Addendum)
TIME SEEN: 11:45 PM  CHIEF COMPLAINT: Bilateral flank pain, subjective fever, gross hematuria, nausea and vomiting  HPI: Patient is a 69 year old female with history of hypertension, COPD, obesity who presents to the emergency department with 1 week of gross hematuria that has progressively worsened and now having bilateral flank pain, nausea and vomiting.  Reports subjective fevers.  No diarrhea.  No abdominal pain.  She is concerned she has a kidney infection.  Went to urgent care today and they sent her to the emergency department.  No history of previous kidney stones.  ROS: See HPI Constitutional: Subjective fever  Eyes: no drainage  ENT: no runny nose   Cardiovascular:  no chest pain  Resp: no SOB  GI:  vomiting GU: no dysuria; + hematuria Integumentary: no rash  Allergy: no hives  Musculoskeletal: no leg swelling  Neurological: no slurred speech ROS otherwise negative  PAST MEDICAL HISTORY/PAST SURGICAL HISTORY:  Past Medical History:  Diagnosis Date  . Anxiety   . Arthritis   . Chest pain 09/2017  . COPD (chronic obstructive pulmonary disease) ([facility])   . Depression   . HTN (hypertension)   . Obesity, Class III, BMI 40-49.9 (morbid obesity) ([facility])   . Sleep apnea    history of sleep apnea  . Smoking     MEDICATIONS:  Prior to Admission medications   Medication Sig Start Date End Date Taking? Authorizing Provider  albuterol (PROVENTIL HFA;VENTOLIN HFA) 108 (90 Base) MCG/ACT inhaler Inhale 2 puffs into the lungs as needed. 09/21/16   [provider]  aspirin 81 MG chewable tablet Chew 1 tablet (81 mg total) by mouth daily. 09/29/17   Barnetta Chapel, MD  atorvastatin (LIPITOR) 80 MG tablet Take 1 tablet (80 mg total) by mouth daily at 6 PM. 09/28/17   Barnetta Chapel, MD  cetirizine (ZYRTEC) 10 MG tablet Take 10 mg by mouth every morning.    [provider]  Fluticasone-Salmeterol (ADVAIR) 500-50 MCG/DOSE AEPB Inhale 1 puff into the lungs 2 (two)  times daily. 10/10/17   [provider]  lisinopril (PRINIVIL,ZESTRIL) 5 MG tablet Take 1 tablet (5 mg total) by mouth daily. 02/17/18   Lars Masson, MD  metoprolol tartrate (LOPRESSOR) 50 MG tablet Take 50 mg by mouth 2 (two) times daily.    [provider]  oxybutynin (DITROPAN) 5 MG tablet Take 5 mg by mouth every morning.    [provider]  PARoxetine (PAXIL) 20 MG tablet Take 40 mg by mouth daily.    [provider]  spironolactone (ALDACTONE) 25 MG tablet Take 0.5 tablets (12.5 mg total) by mouth daily. 02/17/18   Lars Masson, MD  venlafaxine (EFFEXOR) 37.5 MG tablet Take 37.5 mg by mouth daily.    [provider]    ALLERGIES:  Allergies  Allergen Reactions  . Arlice Colt Isothiocyanate] Anaphylaxis  . Hydrochlorothiazide Hives and Other (See Comments)    "swells, turns red, feels like it's burning" "swells, turns red, feels like it's burning" Feels like she's on fire    SOCIAL HISTORY:  Social History   Tobacco Use  . Smoking status: Current Every Day Smoker    Packs/day: 1.00    Types: Cigarettes  . Smokeless tobacco: Never Used  . Tobacco comment: 1PPD since age 63  Substance Use Topics  . Alcohol use: Yes    Comment: rare    FAMILY HISTORY: Family History  Problem Relation Age of Onset  . Heart attack Father 75  .  Heart attack Sister 74       s/p PPM after a "major heart attack"  . CAD Son 22    EXAM: BP (!) 170/68 (BP Location: Left Arm)   Pulse (!) 54   Temp 97.6 F (36.4 C) (Oral)   Resp 20   Ht 4\' 11"  (1.499 m)   Wt 85.7 kg   LMP  (LMP Unknown)   SpO2 98%   BMI 38.17 kg/m  CONSTITUTIONAL: Alert and oriented and responds appropriately to questions. Well-appearing; well-nourished, afebrile, nontoxic, patient does not appear to be in significant distress HEAD: Normocephalic EYES: Conjunctivae clear, pupils appear equal, EOM appear intact ENT: normal nose; moist mucous membranes NECK:  Supple, normal ROM CARD: RRR; S1 and S2 appreciated; no murmurs, no clicks, no rubs, no gallops RESP: Normal chest excursion without splinting or tachypnea; breath sounds clear and equal bilaterally; no wheezes, no rhonchi, no rales, no hypoxia or respiratory distress, speaking full sentences ABD/GI: Normal bowel sounds; non-distended; soft, non-tender, no rebound, no guarding, no peritoneal signs, no hepatosplenomegaly BACK:  The back appears normal, bilateral CVA tenderness on exam and lower back tenderness over the paraspinal muscles, no midline step-off or deformity EXT: Normal ROM in all joints; no deformity noted, no edema; no cyanosis SKIN: Normal color for age and race; warm; no rash on exposed skin NEURO: Moves all extremities equally, ambulates with steady gait PSYCH: The patient's mood and manner are appropriate.   MEDICAL DECISION MAKING: Patient here with flank pain, nausea vomiting, gross hematuria.  Urine obtained in triage shows greater than 50 red blood cells, greater than 50 white blood cells with few bacteria.  Otherwise evaluation not able to be performed due to urine pigment.  Labs show leukocytosis of 12.8.   Cr mildly elevated. Will obtain CT scan to evaluate for infected stone.  Eating pain and nausea medicine as well as IV fluids.  ED PROGRESS: CT shows moderate right-sided hydroureteronephrosis with moderate perinephric edema.  There is an ill-defined hyperdensity within the distal 2 cm of the right ureter without calcified stone.  Radiologist reports this could be related to hemorrhage, non-radiopaque calculus or ureteral lesion and recommend further evaluation with cystoscopy.  She also has some mild left hydroureteronephrosis with minimal left perinephric edema but no obvious cause for obstruction and no stone.  Will discuss with urology on-call.   Spoke with Dr. on-call for urology.  Appreciate his help.  He has reviewed patient's CT imaging.  He states that  patient's pain is well controlled and she is afebrile, nontoxic-appearing that it is reasonable to discharge her home with close outpatient follow-up for further evaluation.  He given she is a smoker, patient is at risk for ureteral tumor.  He agrees with discharging her home on antibiotics after dose of IV Rocephin here.  Will discharge with cefdinir 300 mg twice daily for 10 days.  Discussed return precautions including increasing pain, intractable vomiting, fever of 101 or higher, inability to urinate for several hours.  Patient's pain was reportedly 8/10 upon arrival but is now down to a 4/10 and she is resting comfortably.  Will give 1 Percocet tablet prior to discharge.  Given mildly elevated creatinine have discussed with patient that she should avoid NSAIDs.  Will discharge with prescription for cefdinir, Percocet, Zofran and outpatient urology follow-up information.  Have recommended tobacco cessation.  At this time, I do not feel there is any life-threatening condition present. I have reviewed, interpreted and discussed all results (EKG, imaging,  lab, urine as appropriate) and exam findings with patient/family. I have reviewed nursing notes and appropriate previous records.  I feel the patient is safe to be discharged home without further emergent workup and can continue workup as an outpatient as needed. Discussed usual and customary return precautions. Patient/family verbalize understanding and are comfortable with this plan.  Outpatient follow-up has been provided as needed. All questions have been answered.   Jenna [name] was evaluated in Emergency Department on 12/14/2019 for the symptoms described in the history of present illness. She was evaluated in the context of the global COVID-19 pandemic, which necessitated consideration that the patient might be at risk for infection with the SARS-CoV-2 virus that causes COVID-19. Institutional protocols and algorithms that pertain to the evaluation of  patients at risk for COVID-19 are in a state of rapid change based on information released by regulatory bodies including the CDC and federal and state organizations. These policies and algorithms were followed during the patient's care in the ED.      Jailyne Chieffo, Layla Maw, DO 12/15/19 0155    Gazelle Towe, Layla Maw, DO 12/15/19 [id number]

## 2019-12-14 NOTE — ED Triage Notes (Signed)
Pt arrives to ED w/ c/o lower back pain, emesis, and hematuria x 1 week.

## 2019-12-15 ENCOUNTER — Emergency Department (HOSPITAL_COMMUNITY): Payer: Medicare Other

## 2019-12-15 MED ORDER — SODIUM CHLORIDE 0.9 % IV SOLN
1.0000 g | Freq: Once | INTRAVENOUS | Status: AC
  Administered 2019-12-15: 1 g via INTRAVENOUS
  Filled 2019-12-15: qty 10

## 2019-12-15 MED ORDER — CEFDINIR 300 MG PO CAPS
300.0000 mg | ORAL_CAPSULE | Freq: Two times a day (BID) | ORAL | 0 refills | Status: DC
Start: 2019-12-15 — End: 2020-02-08

## 2019-12-15 MED ORDER — OXYCODONE-ACETAMINOPHEN 5-325 MG PO TABS
1.0000 | ORAL_TABLET | Freq: Once | ORAL | Status: AC
  Administered 2019-12-15: 1 via ORAL
  Filled 2019-12-15: qty 1

## 2019-12-15 MED ORDER — ONDANSETRON 4 MG PO TBDP: 4.0000 mg | ORAL_TABLET | Freq: Four times a day (QID) | ORAL | 0 refills | Status: AC | PRN

## 2019-12-15 MED ORDER — OXYCODONE-ACETAMINOPHEN 5-325 MG PO TABS: 1.0000 | ORAL_TABLET | ORAL | 0 refills | Status: DC | PRN

## 2019-12-15 NOTE — ED Notes (Signed)
Patient verbalizes understanding of discharge instructions. Opportunity for questioning and answers were provided. Armband removed by staff, pt discharged from ED stable & ambulatory  

## 2019-12-15 NOTE — Discharge Instructions (Addendum)
You were seen in the emergency department for back pain.  Your urine today appeared infected and your CT scan was concerning for a mass in your right ureter which could be a stone or blood from infection but also possibly could be from cancer/tumor.  We have talked to the urologist on-call and they recommend that you call their office the beginning of next week to schedule close outpatient follow-up.  We have given you a dose of IV antibiotics here and recommended she take antibiotics for the next week.  If you develop worsening pain that is uncontrolled with your pain medication, nausea and vomiting cannot stop vomiting despite nausea medicine, fever of 101 or higher, please return to the emergency department.  Your kidney function was slightly elevated today with creatinine of 1.49.  We recommend avoiding NSAIDs such as aspirin, Aleve, ibuprofen.  It is safe to take Tylenol.  We also recommend that you stop smoking.  Please follow-up with your primary care physician if you need resources, prescriptions to stop.   You are being provided a prescription for opiates (also known as narcotics) for pain control.  Opiates can be addictive and should only be used when absolutely necessary for pain control when other alternatives do not work.  We recommend you only use them for the recommended amount of time and only as prescribed.  Please do not take with other sedative medications or alcohol.  Please do not drive, operate machinery, make important decisions while taking opiates.  Please note that these medications can be addictive and have high abuse potential.  Patients can become addicted to narcotics after only taking them for a few days.  Please keep these medications locked away from children, teenagers or any family members with history of substance abuse.  Narcotic pain medicine may also make you constipated.  You may use over-the-counter medications such as MiraLAX, Colace to prevent constipation.  If you  become constipated you may use over-the-counter enemas as needed.  Itching and nausea are common side effects of narcotic pain medication.  If you develop uncontrolled vomiting or a rash, please stop these medications.

## 2019-12-15 NOTE — ED Notes (Signed)
Pt transported to CT renal study via stretcher

## 2019-12-17 LAB — URINE CULTURE: Culture: >=100000 — AB

## 2020-02-07 ENCOUNTER — Encounter (HOSPITAL_COMMUNITY): Payer: Self-pay

## 2020-02-07 ENCOUNTER — Other Ambulatory Visit: Payer: Self-pay

## 2020-02-07 ENCOUNTER — Inpatient Hospital Stay (HOSPITAL_COMMUNITY)
Admission: EM | Admit: 2020-02-07 | Discharge: 2020-02-12 | DRG: 660 | Disposition: A | Discharge status: Home / Self Care | Payer: Medicare Other | Attending: Internal Medicine | Admitting: Internal Medicine

## 2020-02-07 DIAGNOSIS — F419 Anxiety disorder, unspecified: Secondary | ICD-10-CM | POA: Diagnosis present

## 2020-02-07 DIAGNOSIS — I130 Hypertensive heart and chronic kidney disease with heart failure and stage 1 through stage 4 chronic kidney disease, or unspecified chronic kidney disease: Secondary | ICD-10-CM | POA: Diagnosis present

## 2020-02-07 DIAGNOSIS — I2510 Atherosclerotic heart disease of native coronary artery without angina pectoris: Secondary | ICD-10-CM | POA: Diagnosis present

## 2020-02-07 DIAGNOSIS — I5032 Chronic diastolic (congestive) heart failure: Secondary | ICD-10-CM | POA: Diagnosis present

## 2020-02-07 DIAGNOSIS — J449 Chronic obstructive pulmonary disease, unspecified: Secondary | ICD-10-CM | POA: Diagnosis present

## 2020-02-07 DIAGNOSIS — E871 Hypo-osmolality and hyponatremia: Secondary | ICD-10-CM | POA: Diagnosis present

## 2020-02-07 DIAGNOSIS — F17210 Nicotine dependence, cigarettes, uncomplicated: Secondary | ICD-10-CM | POA: Diagnosis present

## 2020-02-07 DIAGNOSIS — E872 Acidosis, unspecified: Secondary | ICD-10-CM

## 2020-02-07 DIAGNOSIS — F329 Major depressive disorder, single episode, unspecified: Secondary | ICD-10-CM | POA: Diagnosis present

## 2020-02-07 DIAGNOSIS — E875 Hyperkalemia: Secondary | ICD-10-CM | POA: Diagnosis not present

## 2020-02-07 DIAGNOSIS — G4733 Obstructive sleep apnea (adult) (pediatric): Secondary | ICD-10-CM | POA: Diagnosis present

## 2020-02-07 DIAGNOSIS — R778 Other specified abnormalities of plasma proteins: Secondary | ICD-10-CM

## 2020-02-07 DIAGNOSIS — Z9102 Food additives allergy status: Secondary | ICD-10-CM

## 2020-02-07 DIAGNOSIS — Z7982 Long term (current) use of aspirin: Secondary | ICD-10-CM

## 2020-02-07 DIAGNOSIS — N179 Acute kidney failure, unspecified: Secondary | ICD-10-CM | POA: Diagnosis not present

## 2020-02-07 DIAGNOSIS — Z6836 Body mass index (BMI) 36.0-36.9, adult: Secondary | ICD-10-CM

## 2020-02-07 DIAGNOSIS — Z8249 Family history of ischemic heart disease and other diseases of the circulatory system: Secondary | ICD-10-CM

## 2020-02-07 DIAGNOSIS — Z79899 Other long term (current) drug therapy: Secondary | ICD-10-CM

## 2020-02-07 DIAGNOSIS — E782 Mixed hyperlipidemia: Secondary | ICD-10-CM | POA: Diagnosis present

## 2020-02-07 DIAGNOSIS — Z20822 Contact with and (suspected) exposure to covid-19: Secondary | ICD-10-CM | POA: Diagnosis present

## 2020-02-07 DIAGNOSIS — D649 Anemia, unspecified: Secondary | ICD-10-CM | POA: Diagnosis present

## 2020-02-07 DIAGNOSIS — E860 Dehydration: Secondary | ICD-10-CM | POA: Diagnosis present

## 2020-02-07 DIAGNOSIS — N1330 Unspecified hydronephrosis: Secondary | ICD-10-CM | POA: Diagnosis present

## 2020-02-07 DIAGNOSIS — F17200 Nicotine dependence, unspecified, uncomplicated: Secondary | ICD-10-CM | POA: Diagnosis present

## 2020-02-07 DIAGNOSIS — Z87442 Personal history of urinary calculi: Secondary | ICD-10-CM

## 2020-02-07 DIAGNOSIS — N189 Chronic kidney disease, unspecified: Secondary | ICD-10-CM | POA: Diagnosis present

## 2020-02-07 DIAGNOSIS — N111 Chronic obstructive pyelonephritis: Secondary | ICD-10-CM | POA: Diagnosis present

## 2020-02-07 DIAGNOSIS — R7401 Elevation of levels of liver transaminase levels: Secondary | ICD-10-CM

## 2020-02-07 DIAGNOSIS — N1832 Chronic kidney disease, stage 3b: Secondary | ICD-10-CM | POA: Diagnosis present

## 2020-02-07 DIAGNOSIS — I10 Essential (primary) hypertension: Secondary | ICD-10-CM | POA: Diagnosis present

## 2020-02-07 DIAGNOSIS — Z888 Allergy status to other drugs, medicaments and biological substances status: Secondary | ICD-10-CM

## 2020-02-07 DIAGNOSIS — IMO0001 Reserved for inherently not codable concepts without codable children: Secondary | ICD-10-CM | POA: Diagnosis present

## 2020-02-07 DIAGNOSIS — Z91040 Latex allergy status: Secondary | ICD-10-CM

## 2020-02-07 LAB — URINALYSIS, ROUTINE W REFLEX MICROSCOPIC
Bilirubin Urine: NEGATIVE
Glucose, UA: NEGATIVE mg/dL
Ketones, ur: NEGATIVE mg/dL
Nitrite: NEGATIVE
Protein, ur: NEGATIVE mg/dL
Specific Gravity, Urine: 1.012 (ref 1.005–1.030)
pH: 5 (ref 5.0–8.0)

## 2020-02-07 LAB — CBC WITH DIFFERENTIAL/PLATELET
Abs Immature Granulocytes: 0.09 10*3/uL — ABNORMAL HIGH (ref 0.00–0.07)
Basophils Absolute: 0 10*3/uL (ref 0.0–0.1)
Basophils Relative: 0 %
Eosinophils Absolute: 0.2 10*3/uL (ref 0.0–0.5)
Eosinophils Relative: 1 %
HCT: 43.7 % (ref 36.0–46.0)
Hemoglobin: 13.9 g/dL (ref 12.0–15.0)
Immature Granulocytes: 1 %
Lymphocytes Relative: 16 %
Lymphs Abs: 2.1 10*3/uL (ref 0.7–4.0)
MCH: 29.4 pg (ref 26.0–34.0)
MCHC: 31.8 g/dL (ref 30.0–36.0)
MCV: 92.6 fL (ref 80.0–100.0)
Monocytes Absolute: 0.9 10*3/uL (ref 0.1–1.0)
Monocytes Relative: 7 %
Neutro Abs: 9.9 10*3/uL — ABNORMAL HIGH (ref 1.7–7.7)
Neutrophils Relative %: 75 %
Platelets: 387 10*3/uL (ref 150–400)
RBC: 4.72 MIL/uL (ref 3.87–5.11)
RDW: 14.6 % (ref 11.5–15.5)
WBC: 13.1 10*3/uL — ABNORMAL HIGH (ref 4.0–10.5)
nRBC: 0 % (ref 0.0–0.2)

## 2020-02-07 LAB — COMPREHENSIVE METABOLIC PANEL
ALT: 54 U/L — ABNORMAL HIGH (ref 0–44)
AST: 40 U/L (ref 15–41)
Albumin: 3.2 g/dL — ABNORMAL LOW (ref 3.5–5.0)
Alkaline Phosphatase: 116 U/L (ref 38–126)
Anion gap: 13 (ref 5–15)
BUN: 72 mg/dL — ABNORMAL HIGH (ref 8–23)
CO2: 13 mmol/L — ABNORMAL LOW (ref 22–32)
Calcium: 9.2 mg/dL (ref 8.9–10.3)
Chloride: 102 mmol/L (ref 98–111)
Creatinine, Ser: 4.35 mg/dL — ABNORMAL HIGH (ref 0.44–1.00)
GFR calc Af Amer: 11 mL/min — ABNORMAL LOW (ref >60)
GFR calc non Af Amer: 10 mL/min — ABNORMAL LOW (ref >60)
Glucose, Bld: 94 mg/dL (ref 70–99)
Potassium: 5.4 mmol/L — ABNORMAL HIGH (ref 3.5–5.1)
Sodium: 128 mmol/L — ABNORMAL LOW (ref 135–145)
Total Bilirubin: 0.7 mg/dL (ref 0.3–1.2)
Total Protein: 8.7 g/dL — ABNORMAL HIGH (ref 6.5–8.1)

## 2020-02-07 NOTE — ED Triage Notes (Addendum)
Pt sent her by her [facility] PCP for acute kidney failure. Pt usually goes to [facility] for treatment. Pt reports she was seen here in July and has just felt bad ever since, seen her PCP today, PCP reports change in Cr, sent pt here to be admitted for further evaluation  Pt c/o constant Right kidney pain

## 2020-02-08 ENCOUNTER — Encounter (HOSPITAL_COMMUNITY)
Admission: EM | Disposition: A | Discharge status: Home / Self Care | Payer: Self-pay | Source: Home / Self Care | Attending: Internal Medicine

## 2020-02-08 ENCOUNTER — Emergency Department (HOSPITAL_COMMUNITY): Payer: Medicare Other

## 2020-02-08 ENCOUNTER — Inpatient Hospital Stay (HOSPITAL_COMMUNITY): Payer: Medicare Other | Admitting: Anesthesiology

## 2020-02-08 ENCOUNTER — Inpatient Hospital Stay (HOSPITAL_COMMUNITY): Payer: Medicare Other

## 2020-02-08 ENCOUNTER — Encounter (HOSPITAL_COMMUNITY): Payer: Self-pay | Admitting: Internal Medicine

## 2020-02-08 DIAGNOSIS — F419 Anxiety disorder, unspecified: Secondary | ICD-10-CM | POA: Diagnosis present

## 2020-02-08 DIAGNOSIS — I5032 Chronic diastolic (congestive) heart failure: Secondary | ICD-10-CM | POA: Diagnosis present

## 2020-02-08 DIAGNOSIS — E860 Dehydration: Secondary | ICD-10-CM | POA: Diagnosis present

## 2020-02-08 DIAGNOSIS — Z888 Allergy status to other drugs, medicaments and biological substances status: Secondary | ICD-10-CM | POA: Diagnosis not present

## 2020-02-08 DIAGNOSIS — Z91040 Latex allergy status: Secondary | ICD-10-CM | POA: Diagnosis not present

## 2020-02-08 DIAGNOSIS — N1330 Unspecified hydronephrosis: Secondary | ICD-10-CM

## 2020-02-08 DIAGNOSIS — Z8249 Family history of ischemic heart disease and other diseases of the circulatory system: Secondary | ICD-10-CM | POA: Diagnosis not present

## 2020-02-08 DIAGNOSIS — J449 Chronic obstructive pulmonary disease, unspecified: Secondary | ICD-10-CM | POA: Diagnosis present

## 2020-02-08 DIAGNOSIS — Z87442 Personal history of urinary calculi: Secondary | ICD-10-CM | POA: Diagnosis not present

## 2020-02-08 DIAGNOSIS — F17210 Nicotine dependence, cigarettes, uncomplicated: Secondary | ICD-10-CM | POA: Diagnosis present

## 2020-02-08 DIAGNOSIS — N1832 Chronic kidney disease, stage 3b: Secondary | ICD-10-CM | POA: Diagnosis present

## 2020-02-08 DIAGNOSIS — E875 Hyperkalemia: Secondary | ICD-10-CM | POA: Diagnosis present

## 2020-02-08 DIAGNOSIS — E782 Mixed hyperlipidemia: Secondary | ICD-10-CM | POA: Diagnosis present

## 2020-02-08 DIAGNOSIS — N179 Acute kidney failure, unspecified: Secondary | ICD-10-CM | POA: Diagnosis present

## 2020-02-08 DIAGNOSIS — Z6836 Body mass index (BMI) 36.0-36.9, adult: Secondary | ICD-10-CM | POA: Diagnosis not present

## 2020-02-08 DIAGNOSIS — Z20822 Contact with and (suspected) exposure to covid-19: Secondary | ICD-10-CM | POA: Diagnosis present

## 2020-02-08 DIAGNOSIS — E871 Hypo-osmolality and hyponatremia: Secondary | ICD-10-CM | POA: Diagnosis present

## 2020-02-08 DIAGNOSIS — N189 Chronic kidney disease, unspecified: Secondary | ICD-10-CM | POA: Diagnosis not present

## 2020-02-08 DIAGNOSIS — N111 Chronic obstructive pyelonephritis: Secondary | ICD-10-CM | POA: Diagnosis present

## 2020-02-08 DIAGNOSIS — I2510 Atherosclerotic heart disease of native coronary artery without angina pectoris: Secondary | ICD-10-CM | POA: Diagnosis present

## 2020-02-08 DIAGNOSIS — I130 Hypertensive heart and chronic kidney disease with heart failure and stage 1 through stage 4 chronic kidney disease, or unspecified chronic kidney disease: Secondary | ICD-10-CM | POA: Diagnosis present

## 2020-02-08 DIAGNOSIS — F329 Major depressive disorder, single episode, unspecified: Secondary | ICD-10-CM | POA: Diagnosis present

## 2020-02-08 DIAGNOSIS — G4733 Obstructive sleep apnea (adult) (pediatric): Secondary | ICD-10-CM | POA: Diagnosis present

## 2020-02-08 DIAGNOSIS — Z9102 Food additives allergy status: Secondary | ICD-10-CM | POA: Diagnosis not present

## 2020-02-08 DIAGNOSIS — D649 Anemia, unspecified: Secondary | ICD-10-CM | POA: Diagnosis present

## 2020-02-08 HISTORY — PX: CYSTOSCOPY W/ URETERAL STENT PLACEMENT: SHX1429

## 2020-02-08 LAB — COMPREHENSIVE METABOLIC PANEL
ALT: 46 U/L — ABNORMAL HIGH (ref 0–44)
AST: 29 U/L (ref 15–41)
Albumin: 3.5 g/dL (ref 3.5–5.0)
Alkaline Phosphatase: 119 U/L (ref 38–126)
Anion gap: 11 (ref 5–15)
BUN: 76 mg/dL — ABNORMAL HIGH (ref 8–23)
CO2: 14 mmol/L — ABNORMAL LOW (ref 22–32)
Calcium: 9.1 mg/dL (ref 8.9–10.3)
Chloride: 104 mmol/L (ref 98–111)
Creatinine, Ser: 4.18 mg/dL — ABNORMAL HIGH (ref 0.44–1.00)
GFR calc Af Amer: 12 mL/min — ABNORMAL LOW (ref >60)
GFR calc non Af Amer: 10 mL/min — ABNORMAL LOW (ref >60)
Glucose, Bld: 120 mg/dL — ABNORMAL HIGH (ref 70–99)
Potassium: 4.9 mmol/L (ref 3.5–5.1)
Sodium: 129 mmol/L — ABNORMAL LOW (ref 135–145)
Total Bilirubin: 0.3 mg/dL (ref 0.3–1.2)
Total Protein: 8.8 g/dL — ABNORMAL HIGH (ref 6.5–8.1)

## 2020-02-08 LAB — URINE CULTURE

## 2020-02-08 LAB — CBC
HCT: 42.2 % (ref 36.0–46.0)
Hemoglobin: 13.8 g/dL (ref 12.0–15.0)
MCH: 29.2 pg (ref 26.0–34.0)
MCHC: 32.7 g/dL (ref 30.0–36.0)
MCV: 89.4 fL (ref 80.0–100.0)
Platelets: 370 10*3/uL (ref 150–400)
RBC: 4.72 MIL/uL (ref 3.87–5.11)
RDW: 14.6 % (ref 11.5–15.5)
WBC: 14.3 10*3/uL — ABNORMAL HIGH (ref 4.0–10.5)
nRBC: 0 % (ref 0.0–0.2)

## 2020-02-08 LAB — BASIC METABOLIC PANEL
Anion gap: 12 (ref 5–15)
BUN: 67 mg/dL — ABNORMAL HIGH (ref 8–23)
CO2: 15 mmol/L — ABNORMAL LOW (ref 22–32)
Calcium: 8.9 mg/dL (ref 8.9–10.3)
Chloride: 106 mmol/L (ref 98–111)
Creatinine, Ser: 4.03 mg/dL — ABNORMAL HIGH (ref 0.44–1.00)
GFR calc Af Amer: 12 mL/min — ABNORMAL LOW (ref >60)
GFR calc non Af Amer: 11 mL/min — ABNORMAL LOW (ref >60)
Glucose, Bld: 92 mg/dL (ref 70–99)
Potassium: 5.7 mmol/L — ABNORMAL HIGH (ref 3.5–5.1)
Sodium: 133 mmol/L — ABNORMAL LOW (ref 135–145)

## 2020-02-08 LAB — SARS CORONAVIRUS 2 BY RT PCR (HOSPITAL ORDER, PERFORMED IN ~~LOC~~ HOSPITAL LAB): SARS Coronavirus 2: NEGATIVE

## 2020-02-08 SURGERY — CYSTOSCOPY, WITH RETROGRADE PYELOGRAM AND URETERAL STENT INSERTION
Anesthesia: General | Laterality: Right

## 2020-02-08 MED ORDER — SODIUM CHLORIDE 0.9 % IR SOLN
Status: DC | PRN
  Administered 2020-02-08: 3000 mL

## 2020-02-08 MED ORDER — ONDANSETRON HCL 4 MG PO TABS: 4.0000 mg | ORAL_TABLET | Freq: Four times a day (QID) | ORAL | Status: DC | PRN

## 2020-02-08 MED ORDER — OXYBUTYNIN CHLORIDE 5 MG PO TABS
5.0000 mg | ORAL_TABLET | Freq: Every day | ORAL | Status: DC
  Administered 2020-02-09 – 2020-02-12 (×4): 5 mg via ORAL
  Filled 2020-02-08 (×4): qty 1

## 2020-02-08 MED ORDER — ATORVASTATIN CALCIUM 40 MG PO TABS
80.0000 mg | ORAL_TABLET | Freq: Every day | ORAL | Status: DC
  Administered 2020-02-08 – 2020-02-11 (×4): 80 mg via ORAL
  Filled 2020-02-08 (×4): qty 2

## 2020-02-08 MED ORDER — LIP MEDEX EX OINT
TOPICAL_OINTMENT | CUTANEOUS | Status: AC
  Administered 2020-02-08: 1
  Filled 2020-02-08: qty 7

## 2020-02-08 MED ORDER — EZETIMIBE 10 MG PO TABS
10.0000 mg | ORAL_TABLET | Freq: Every day | ORAL | Status: DC
  Administered 2020-02-09 – 2020-02-12 (×4): 10 mg via ORAL
  Filled 2020-02-08 (×4): qty 1

## 2020-02-08 MED ORDER — IOHEXOL 300 MG/ML  SOLN
INTRAMUSCULAR | Status: DC | PRN
  Administered 2020-02-08: 20 mL

## 2020-02-08 MED ORDER — SODIUM ZIRCONIUM CYCLOSILICATE 10 G PO PACK
10.0000 g | PACK | Freq: Once | ORAL | Status: AC
  Administered 2020-02-08: 10 g via ORAL
  Filled 2020-02-08: qty 1

## 2020-02-08 MED ORDER — METOPROLOL TARTRATE 50 MG PO TABS: 50.0000 mg | ORAL_TABLET | Freq: Two times a day (BID) | ORAL | Status: DC

## 2020-02-08 MED ORDER — LIDOCAINE 2% (20 MG/ML) 5 ML SYRINGE
INTRAMUSCULAR | Status: DC | PRN
  Administered 2020-02-08: 60 mg via INTRAVENOUS

## 2020-02-08 MED ORDER — FENTANYL CITRATE (PF) 100 MCG/2ML IJ SOLN
INTRAMUSCULAR | Status: AC
  Filled 2020-02-08: qty 2

## 2020-02-08 MED ORDER — FENTANYL CITRATE (PF) 100 MCG/2ML IJ SOLN: 25.0000 ug | INTRAMUSCULAR | Status: DC | PRN

## 2020-02-08 MED ORDER — OXYBUTYNIN CHLORIDE 5 MG PO TABS: 5.0000 mg | ORAL_TABLET | Freq: Every day | ORAL | Status: DC

## 2020-02-08 MED ORDER — ACETAMINOPHEN 650 MG RE SUPP: 650.0000 mg | Freq: Four times a day (QID) | RECTAL | Status: DC | PRN

## 2020-02-08 MED ORDER — DEXAMETHASONE SODIUM PHOSPHATE 10 MG/ML IJ SOLN
INTRAMUSCULAR | Status: DC | PRN
  Administered 2020-02-08: 10 mg via INTRAVENOUS

## 2020-02-08 MED ORDER — SODIUM CHLORIDE 0.9 % IV SOLN
1.0000 g | INTRAVENOUS | Status: DC
  Administered 2020-02-08 – 2020-02-10 (×3): 1 g via INTRAVENOUS
  Filled 2020-02-08 (×3): qty 1

## 2020-02-08 MED ORDER — SODIUM CHLORIDE 0.9 % IV SOLN: INTRAVENOUS | Status: DC

## 2020-02-08 MED ORDER — ONDANSETRON HCL 4 MG/2ML IJ SOLN: 4.0000 mg | Freq: Once | INTRAMUSCULAR | Status: DC | PRN

## 2020-02-08 MED ORDER — LACTATED RINGERS IV SOLN: INTRAVENOUS | Status: DC

## 2020-02-08 MED ORDER — PROPOFOL 10 MG/ML IV BOLUS
INTRAVENOUS | Status: AC
  Filled 2020-02-08: qty 20

## 2020-02-08 MED ORDER — MIDAZOLAM HCL 2 MG/2ML IJ SOLN
INTRAMUSCULAR | Status: AC
  Filled 2020-02-08: qty 2

## 2020-02-08 MED ORDER — VENLAFAXINE HCL ER 37.5 MG PO CP24
37.5000 mg | ORAL_CAPSULE | Freq: Every day | ORAL | Status: DC
  Administered 2020-02-09 – 2020-02-12 (×4): 37.5 mg via ORAL
  Filled 2020-02-08 (×4): qty 1

## 2020-02-08 MED ORDER — ACETAMINOPHEN 325 MG PO TABS: 650.0000 mg | ORAL_TABLET | Freq: Four times a day (QID) | ORAL | Status: DC | PRN

## 2020-02-08 MED ORDER — NITROGLYCERIN 0.4 MG SL SUBL: 0.4000 mg | SUBLINGUAL_TABLET | SUBLINGUAL | Status: DC | PRN

## 2020-02-08 MED ORDER — OXYCODONE HCL 5 MG PO TABS: 5.0000 mg | ORAL_TABLET | ORAL | Status: DC | PRN

## 2020-02-08 MED ORDER — PHENYLEPHRINE 40 MCG/ML (10ML) SYRINGE FOR IV PUSH (FOR BLOOD PRESSURE SUPPORT)
PREFILLED_SYRINGE | INTRAVENOUS | Status: DC | PRN
  Administered 2020-02-08: 120 ug via INTRAVENOUS

## 2020-02-08 MED ORDER — MIDAZOLAM HCL 2 MG/2ML IJ SOLN
INTRAMUSCULAR | Status: DC | PRN
  Administered 2020-02-08: 2 mg via INTRAVENOUS

## 2020-02-08 MED ORDER — CEFAZOLIN SODIUM-DEXTROSE 2-4 GM/100ML-% IV SOLN
2.0000 g | INTRAVENOUS | Status: AC
  Administered 2020-02-08: 2 g via INTRAVENOUS
  Filled 2020-02-08: qty 100

## 2020-02-08 MED ORDER — CHLORHEXIDINE GLUCONATE 0.12 % MT SOLN
15.0000 mL | OROMUCOSAL | Status: AC
  Administered 2020-02-08: 15 mL via OROMUCOSAL

## 2020-02-08 MED ORDER — METOPROLOL TARTRATE 50 MG PO TABS
50.0000 mg | ORAL_TABLET | Freq: Two times a day (BID) | ORAL | Status: DC
  Administered 2020-02-08 – 2020-02-12 (×7): 50 mg via ORAL
  Filled 2020-02-08 (×8): qty 1

## 2020-02-08 MED ORDER — CHLORHEXIDINE GLUCONATE CLOTH 2 % EX PADS
6.0000 | MEDICATED_PAD | Freq: Every day | CUTANEOUS | Status: DC
  Administered 2020-02-08 – 2020-02-10 (×2): 6 via TOPICAL

## 2020-02-08 MED ORDER — NICOTINE 21 MG/24HR TD PT24
21.0000 mg | MEDICATED_PATCH | Freq: Every day | TRANSDERMAL | Status: DC
  Administered 2020-02-08 – 2020-02-12 (×5): 21 mg via TRANSDERMAL
  Filled 2020-02-08 (×5): qty 1

## 2020-02-08 MED ORDER — LORATADINE 10 MG PO TABS
10.0000 mg | ORAL_TABLET | Freq: Every day | ORAL | Status: DC
  Administered 2020-02-09 – 2020-02-12 (×4): 10 mg via ORAL
  Filled 2020-02-08 (×4): qty 1

## 2020-02-08 MED ORDER — ACETAMINOPHEN 10 MG/ML IV SOLN: 1000.0000 mg | Freq: Once | INTRAVENOUS | Status: DC | PRN

## 2020-02-08 MED ORDER — ONDANSETRON HCL 4 MG/2ML IJ SOLN
4.0000 mg | Freq: Four times a day (QID) | INTRAMUSCULAR | Status: DC | PRN
  Administered 2020-02-08: 4 mg via INTRAVENOUS

## 2020-02-08 MED ORDER — CEFAZOLIN (ANCEF) 1 G IV SOLR: 2.0000 g | INTRAVENOUS | Status: DC

## 2020-02-08 MED ORDER — FENTANYL CITRATE (PF) 100 MCG/2ML IJ SOLN
INTRAMUSCULAR | Status: DC | PRN
  Administered 2020-02-08: 50 ug via INTRAVENOUS

## 2020-02-08 MED ORDER — PROPOFOL 500 MG/50ML IV EMUL
INTRAVENOUS | Status: DC | PRN
  Administered 2020-02-08: 150 mg via INTRAVENOUS

## 2020-02-08 SURGICAL SUPPLY — 12 items
BAG URO CATCHER STRL LF (MISCELLANEOUS) ×2 IMPLANT
CATH URET 5FR 28IN OPEN ENDED (CATHETERS) IMPLANT
CLOTH BEACON ORANGE TIMEOUT ST (SAFETY) ×2 IMPLANT
GLOVE SURG SS PI 8.0 STRL IVOR (GLOVE) IMPLANT
GOWN STRL REUS W/TWL XL LVL3 (GOWN DISPOSABLE) ×2 IMPLANT
GUIDEWIRE STR DUAL SENSOR (WIRE) ×2 IMPLANT
KIT TURNOVER KIT A (KITS) IMPLANT
MANIFOLD NEPTUNE II (INSTRUMENTS) ×2 IMPLANT
PACK CYSTO (CUSTOM PROCEDURE TRAY) ×2 IMPLANT
STENT URET 6FRX22 CONTOUR (STENTS) ×2 IMPLANT
TUBING CONNECTING 10 (TUBING) ×2 IMPLANT
TUBING UROLOGY SET (TUBING) IMPLANT

## 2020-02-08 NOTE — Anesthesia Procedure Notes (Signed)
Procedure Name: LMA Insertion Date/Time: 02/08/2020 4:39 PM Performed by: Basilio Cairo, CRNA Pre-anesthesia Checklist: Patient identified, Patient being monitored, Timeout performed, Emergency Drugs available and Suction available Patient Re-evaluated:Patient Re-evaluated prior to induction Oxygen Delivery Method: Circle system utilized Preoxygenation: Pre-oxygenation with 100% oxygen Induction Type: IV induction Ventilation: Mask ventilation without difficulty LMA: LMA inserted LMA Size: 4.0 Tube type: Oral Number of attempts: 1 Placement Confirmation: positive ETCO2 and breath sounds checked- equal and bilateral Tube secured with: Tape Dental Injury: Teeth and Oropharynx as per pre-operative assessment

## 2020-02-08 NOTE — Discharge Instructions (Signed)

## 2020-02-08 NOTE — Progress Notes (Signed)
PROGRESS NOTE   Jenna [name]  GUY:[id number]    DOB: Jan 23, 1951    DOA: 02/07/2020  PCP: Ward, Clois Comber, FNP   I have briefly reviewed patients previous medical records in [facility].  Chief Complaint  Patient presents with  . Abnormal Lab    Brief Narrative:  69 year old female with PMH of anxiety, depression, COPD, HTN, OSA, tobacco abuse, CAD, referred to ED by PCP due to right flank pain and worsening renal failure.  She had been seen in the ED on 12/14/2019 for flank pain and gross hematuria, CT head showed moderate right-sided hydroureteronephrosis with ill-defined hyperdensity within the distal 2 cm of the right ureter and some mild left hydroureteronephrosis, recommended outpatient follow-up with urology which she was unable to do due to financial constraints.  Since then she has continued to have right flank pain but no dysuria or hematuria.  Admitted for acute on chronic kidney disease, persistent moderate right hydronephrosis and hydroureter with distal ureteral obstruction, hyperkalemia and metabolic acidosis.  Urology consulted and have scheduled her for cystoscopy with right retrograde pyelography and stent later this afternoon.   Assessment & Plan:  Principal Problem:   Hydronephrosis of right kidney Active Problems:   HTN (hypertension)   Mixed hyperlipidemia   Smoking   COPD (chronic obstructive pulmonary disease) (HCC)   Hyponatremia   Hyperkalemia   Acute renal failure complicating chronic kidney disease stage IIIb: Most recent creatinine on 7/2: 1.49.  Prior to that creatinine in 2019 was normal at 0.9.  Major component for her acute renal failure seems to be obstructive/right hydroureteronephrosis secondary to distal ureteral obstruction.  Continue IVF, urology addressing hydronephrosis as below.  Creatinine has slightly improved from 4.35-4.18 overnight. Follow BMP later this evening and daily.  Avoid nephrotoxic's.  Hyperkalemia: Potassium 5.4 on  admission.  Resolved after IV fluid hydration alone.  Hold ACEI/Aldactone.  Metabolic acidosis, NAG: Presented with bicarb of 13, likely related to acute renal failure.  Improved to 14.  Treatment as above and follow BMP closely.  Hyponatremia:  Likely due to dehydration and acute renal failure.  Continue normal saline hydration and follow BMP closely.  Right hydroureteronephrosis with distal ureteral obstruction: Urology consultation appreciated.  Concern for urothelial malignancy given history of tobacco abuse and past hematuria.  Plan for cystoscopy, right retrograde pyelography and stent placement later this afternoon.  NPO.  Essential hypertension: Reasonable control.  Hold ACEI, diuretics.  Dyslipidemia:  Continue atorvastatin.  Tobacco abuse: Cessation counseled.  Continue nicotine patch.  COPD: No clinical bronchospasm.  Body mass index is 36.36 kg/m./Obesity   DVT prophylaxis: SCD Code Status: Full Family Communication: None at bedside Disposition:  Status is: Observation  The patient will require care spanning > 2 midnights and should be moved to inpatient because: Persistent severe electrolyte disturbances, Ongoing diagnostic testing needed not appropriate for outpatient work up, IV treatments appropriate due to intensity of illness or inability to take PO and Inpatient level of care appropriate due to severity of illness  Dispo: The patient is from: Home              Anticipated d/c is to: Home              Anticipated d/c date is: > 3 days              Patient currently is not medically stable to d/c.        Consultants:   Urology   Procedures:   None  Antimicrobials:   None    Subjective:  Patient reports mild, 2-3/10 right flank pain.  Denies dysuria, frequency, hematuria, fever or chills.  Objective:   Vitals:   02/08/20 0500 02/08/20 0559 02/08/20 0947 02/08/20 1228  BP: (!) 142/72 (!) 148/84 (!) 151/69 (!) 155/64  Pulse: 71 71 71 72   Resp: 19 16 20  (!) 21  Temp:  98.1 F (36.7 C) 97.6 F (36.4 C) 97.8 F (36.6 C)  TempSrc:  Oral Oral Oral  SpO2: 97% 99% 98% 100%  Weight:      Height:        General exam: Pleasant middle-aged female, moderately built and nourished lying comfortably propped up in bed.  Oral mucosa dry. Respiratory system: Clear to auscultation. Respiratory effort normal. Cardiovascular system: S1 & S2 heard, RRR. No JVD, murmurs, rubs, gallops or clicks. No pedal edema.  Telemetry personally reviewed: Sinus rhythm. Gastrointestinal system: Abdomen is nondistended, soft and nontender. No organomegaly or masses felt. Normal bowel sounds heard. Central nervous system: Alert and oriented. No focal neurological deficits. Extremities: Symmetric 5 x 5 power. Skin: No rashes, lesions or ulcers Psychiatry: Judgement and insight appear normal. Mood & affect appropriate.     Data Reviewed:   I have personally reviewed following labs and imaging studies   CBC: Recent Labs  Lab 02/07/20 1357 02/08/20 0659  WBC 13.1* 14.3*  NEUTROABS 9.9*  --   HGB 13.9 13.8  HCT 43.7 42.2  MCV 92.6 89.4  PLT 387 370    Basic Metabolic Panel: Recent Labs  Lab 02/07/20 1357 02/08/20 0659  NA 128* 129*  K 5.4* 4.9  CL 102 104  CO2 13* 14*  GLUCOSE 94 120*  BUN 72* 76*  CREATININE 4.35* 4.18*  CALCIUM 9.2 9.1    Liver Function Tests: Recent Labs  Lab 02/07/20 1357 02/08/20 0659  AST 40 29  ALT 54* 46*  ALKPHOS 116 119  BILITOT 0.7 0.3  PROT 8.7* 8.8*  ALBUMIN 3.2* 3.5    CBG: No results for input(s): GLUCAP in the last 168 hours.  Microbiology Studies:   Recent Results (from the past 240 hour(s))  SARS Coronavirus 2 by RT PCR (hospital order, performed in [facility] hospital lab) Nasopharyngeal Nasopharyngeal Swab     Status: None   Collection Time: 02/08/20  1:14 AM   Specimen: Nasopharyngeal Swab  Result Value Ref Range Status   SARS Coronavirus 2 NEGATIVE NEGATIVE Final    Comment:  (NOTE) SARS-CoV-2 target nucleic acids are NOT DETECTED.  The SARS-CoV-2 RNA is generally detectable in upper and lower respiratory specimens during the acute phase of infection. The lowest concentration of SARS-CoV-2 viral copies this assay can detect is 250 copies / mL. A negative result does not preclude SARS-CoV-2 infection and should not be used as the sole basis for treatment or other patient management decisions.  A negative result may occur with improper specimen collection / handling, submission of specimen other than nasopharyngeal swab, presence of viral mutation(s) within the areas targeted by this assay, and inadequate number of viral copies (<250 copies / mL). A negative result must be combined with clinical observations, patient history, and epidemiological information.  Fact Sheet for Patients:   02/10/20  Fact Sheet for Healthcare Providers: BoilerBrush.com.cy  This test is not yet approved or  cleared by the https://pope.com/ FDA and has been authorized for detection and/or diagnosis of SARS-CoV-2 by FDA under an Emergency Use Authorization (EUA).  This EUA will remain in effect (  meaning this test can be used) for the duration of the COVID-19 declaration under Section 564(b)(1) of the Act, 21 U.S.C. section 360bbb-3(b)(1), unless the authorization is terminated or revoked sooner.  Performed at [facility], [address], [place], [place] [address]      Radiology Studies:  CT Renal Stone Study  Result Date: 02/08/2020 CLINICAL DATA:  Acute renal failure, emesis, hematuria EXAM: CT ABDOMEN AND PELVIS WITHOUT CONTRAST TECHNIQUE: Multidetector CT imaging of the abdomen and pelvis was performed following the standard protocol without IV contrast. COMPARISON:  12/15/2019 FINDINGS: Lower chest: The visualized lung bases are clear bilaterally. Extensive coronary artery calcification within the right coronary  artery. Extensive calcification of the mitral valve annulus. Global cardiac size within normal limits. Hepatobiliary: Tiny cyst within the right hepatic dome. Liver and gallbladder are otherwise unremarkable. No intra or extrahepatic biliary ductal dilation. Pancreas: Unremarkable Spleen: Unremarkable Adrenals/Urinary Tract: The adrenal glands are unremarkable. There is moderate asymmetric cortical atrophy of the left kidney. The right kidney is normal in size and position. There is moderate right hydronephrosis again identified with marked hydroureter to the level of the distal right ureter where, similar to prior examination, there is intraluminal hyperdensity. This could reflect calcific debris within the distal right ureter, though its persistent since remote prior examination raises the question of an underlying stricture. Additional considerations such as dystrophic calcification secondary to prior intervention or trauma, or metaplastic calcification within a neoplasm are unusual. The bladder is decompressed. No hydronephrosis is seen on the left. Stomach/Bowel: Stomach, small bowel, and large bowel are unremarkable. Appendix normal. No free intraperitoneal gas or fluid. Vascular/Lymphatic: There is extensive aortoiliac atherosclerotic calcification without evidence of aneurysm. Particularly prominent calcification is seen at the origin of the mesenteric vessels, though the degree of stenosis is not well assessed on this non arteriographic examination. No pathologic adenopathy within the abdomen and pelvis. Reproductive: The uterus is absent. No adnexal masses. Radiopacities seen within the pelvic floor posterior to the pubic symphysis may relate to bladder suspension surgery. Other: The rectum is unremarkable. Musculoskeletal: Degenerative changes are seen within the lumbar spine. No lytic or blastic bone lesions are seen. IMPRESSION: 1. Persistent moderate right hydronephrosis and hydroureter to the level of  the distal right ureter, where there is intraluminal hyperdensity. This could reflect calcific debris within the distal right ureter, though its persistent since remote prior examination raises the question of an underlying stricture. Additional considerations such as dystrophic calcification secondary to prior intervention or trauma, or metaplastic calcification within a neoplasm are unusual. 2. Extensive aortoiliac atherosclerotic calcification. Particularly prominent calcification is seen at the origin of the mesenteric vessels, though the degree of stenosis is not well assessed on this non arteriographic examination. Aortic Atherosclerosis (ICD10-I70.0). Electronically Signed   By: Helyn Numbers MD   On: 02/08/2020 02:38     Scheduled Meds:   . nicotine  21 mg Transdermal Daily    Continuous Infusions:   . sodium chloride 100 mL/hr at 02/08/20 0554  .  ceFAZolin (ANCEF) IV       LOS: 0 days     Marcellus Scott, MD, [place], [facility]. [facility]    To contact the attending provider between 7A-7P or the covering provider during after hours 7P-7A, please log into the web site [email or web address] and access using universal  password for that web site. If you do not have the password, please call the hospital operator.  02/08/2020, 1:55 PM

## 2020-02-08 NOTE — ED Notes (Signed)
Pt ambulated to the bathroom with slow and steady gait. Tolerated well.

## 2020-02-08 NOTE — ED Notes (Signed)
Patient transported to CT 

## 2020-02-08 NOTE — Anesthesia Postprocedure Evaluation (Signed)
Anesthesia Post Note  Patient: Jenna [name]  Procedure(s) Performed: CYSTOSCOPY WITH RETROGRADE PYELOGRAM/URETERAL STENT PLACEMENT (Right )     Patient location during evaluation: PACU Anesthesia Type: General Level of consciousness: awake and alert Pain management: pain level controlled Vital Signs Assessment: post-procedure vital signs reviewed and stable Respiratory status: spontaneous breathing, nonlabored ventilation, respiratory function stable and patient connected to nasal cannula oxygen Cardiovascular status: blood pressure returned to baseline and stable Postop Assessment: no apparent nausea or vomiting Anesthetic complications: no   No complications documented.  Last Vitals:  Vitals:   02/08/20 1800 02/08/20 1823  BP: (!) 129/56 (!) 131/48  Pulse: 81 81  Resp: 17 18  Temp: (!) 36.4 C (!) 36.3 C  SpO2: 99% 100%    Last Pain:  Vitals:   02/08/20 1823  TempSrc: Oral  PainSc:                  Catheryn Bacon Analysse Quinonez

## 2020-02-08 NOTE — Anesthesia Preprocedure Evaluation (Addendum)
Anesthesia Evaluation  Patient identified by MRN, date of birth, ID band Patient awake    Reviewed: Allergy & Precautions, NPO status , Patient's Chart, lab work & pertinent test results  Airway Mallampati: III  TM Distance: >3 FB Neck ROM: Full    Dental  (+) Edentulous Upper, Missing   Pulmonary COPD,  COPD inhaler, Current Smoker and Patient abstained from smoking.,    Pulmonary exam normal breath sounds clear to auscultation       Cardiovascular hypertension, Pt. on medications and Pt. on home beta blockers + angina + CAD, + Past MI and +CHF  Normal cardiovascular exam Rhythm:Regular Rate:Normal     Neuro/Psych PSYCHIATRIC DISORDERS Anxiety Depression negative neurological ROS     GI/Hepatic negative GI ROS, Neg liver ROS,   Endo/Other  negative endocrine ROS  Renal/GU CRFRenal disease     Musculoskeletal  (+) Arthritis ,   Abdominal (+) + obese,   Peds  Hematology HLD   Anesthesia Other Findings right hydronephrosis  Reproductive/Obstetrics                            Anesthesia Physical Anesthesia Plan  ASA: IV  Anesthesia Plan: General   Post-op Pain Management:    Induction: Intravenous  PONV Risk Score and Plan: 2 and Ondansetron, Dexamethasone, Midazolam and Treatment may vary due to age or medical condition  Airway Management Planned: LMA  Additional Equipment:   Intra-op Plan:   Post-operative Plan: Extubation in OR  Informed Consent: I have reviewed the patients History and Physical, chart, labs and discussed the procedure including the risks, benefits and alternatives for the proposed anesthesia with the patient or authorized representative who has indicated his/her understanding and acceptance.     Dental advisory given  Plan Discussed with: CRNA  Anesthesia Plan Comments:        Anesthesia Quick Evaluation

## 2020-02-08 NOTE — ED Notes (Signed)
Pt was unable to urinate at this time. Pt placed back on cardiac monitor.

## 2020-02-08 NOTE — H&P (Signed)
History and Physical    Jenna [name] DOB: 1950/12/10 DOA: 02/07/2020  PCP: Ward, Clois Comber, FNP   Patient coming from: Home.   I have personally briefly reviewed patient's old medical records in [facility]  Chief Complaint: Abnormal blood test.  HPI: Jenna [name] is a 69 y.o. female with medical history significant of anxiety, depression, osteoarthritis, COPD, hypertension, class III obesity, history of sleep apnea, smoking who is coming to the emergency department referred by her PCP due to worsening renal function and right flank pain.  Last month the patient was seen due to similar picture and was discharged with instructions to follow-up with urology.  She stated that she was unable to follow up with them due to having to make a copayment that she could not afford.  Since then, she has continued having right flank pain, but denies dysuria, frequency, hematuria or oliguria.  She denies fever, chills, night sweats, changes in appetite or sleep.  She has felt frequently in the mornings nauseous and has occasional small amount of emesis.  She denies rhinorrhea, sore throat, dyspnea, wheezing or hemoptysis.  No chest pain, palpitations, diaphoresis, PND, orthopnea or pitting edema of the lower extremities.  She denies diarrhea, constipation, melena or hematochezia.  No polyuria, polydipsia, polyphagia or blurred vision.  ED Course: Initial vital signs temperature 98 F, pulse 60, respiration 15, blood pressure 132/56 mmHg and O2 sat 100% on room air.  Urinalysis showed moderate hemoglobinuria, large leukocyte esterase, 21-50 WBC and rare bacteria on microscopic examination.  CBC showed a white count of 13.1, hemoglobin 13.9 g/dL and platelets 675.Sodium 128, potassium 5.4, chloride 102 and CO2 13 mmol/L.  Anion gap was 13.  Glucose 94, BUN 72, creatinine 4.35 and calcium 9.2 mg/dL.  Total protein is 8.7, albumin 3.2 g/dL.  ALT was 54, the rest of the hepatic functions are  within expected range.  Imaging: CT renal protocol shows persistent moderate right hydronephrosis and hydroureter to the level of the distal right ureter where there is an intraluminal hyperdensity.  She also has extensive aortoiliac atherosclerotic calcification.  Please see images and full values report for further detail.  Review of Systems: As per HPI otherwise all other systems reviewed and are negative.  Past Medical History:  Diagnosis Date  . Anxiety   . Arthritis   . Chest pain 09/2017  . COPD (chronic obstructive pulmonary disease) (HCC)   . Depression   . HTN (hypertension)   . Obesity, Class III, BMI 40-49.9 (morbid obesity) (HCC)   . Sleep apnea    history of sleep apnea  . Smoking    Past Surgical History:  Procedure Laterality Date  . ABDOMINAL HYSTERECTOMY    . BREAST LUMPECTOMY    . CARPAL TUNNEL RELEASE     bilteral  . CERVICAL DISC SURGERY    . COLONOSCOPY  07/2017  . HEMORROIDECTOMY    . LEFT HEART CATH AND CORONARY ANGIOGRAPHY N/A 09/27/2017   Procedure: LEFT HEART CATH AND CORONARY ANGIOGRAPHY;  Surgeon: Lennette Bihari, MD;  Location: [facility] INVASIVE CV LAB;  Service: Cardiovascular;  Laterality: N/A;  . TONSILLECTOMY     Social History  reports that she has been smoking cigarettes. She has been smoking about 1.00 pack per day. She has never used smokeless tobacco. She reports current alcohol use. She reports previous drug use.  Allergies  Allergen Reactions  . Arlice Colt Isothiocyanate] Anaphylaxis  . Hydrochlorothiazide Hives, Swelling and Other (See Comments)    "  swells, turns red, feels like it's burning" "swells, turns red, feels like it's burning" Feels like she's on fire   Family History  Problem Relation Age of Onset  . Heart attack Father 36  . Heart attack Sister 89       s/p PPM after a "major heart attack"  . CAD Son 53   Prior to Admission medications   Medication Sig Start Date End Date Taking? Authorizing Provider  albuterol  (PROVENTIL HFA;VENTOLIN HFA) 108 (90 Base) MCG/ACT inhaler Inhale 2 puffs into the lungs every 4 (four) hours as needed for wheezing or shortness of breath.  09/21/16   [provider]  albuterol (PROVENTIL) (2.5 MG/3ML) 0.083% nebulizer solution Take 2.5 mg by nebulization every 6 (six) hours as needed for wheezing or shortness of breath.    [provider]  aspirin 81 MG chewable tablet Chew 1 tablet (81 mg total) by mouth daily. Patient not taking: Reported on 12/15/2019 09/29/17   Berton Mount I, MD  atorvastatin (LIPITOR) 80 MG tablet Take 1 tablet (80 mg total) by mouth daily at 6 PM. 09/28/17   Barnetta Chapel, MD  cefdinir (OMNICEF) 300 MG capsule Take 1 capsule (300 mg total) by mouth 2 (two) times daily. 12/15/19   Ward, Layla Maw, DO  cetirizine (ZYRTEC) 10 MG tablet Take 10 mg by mouth every morning.    [provider]  dapagliflozin propanediol (FARXIGA) 5 MG TABS tablet Take 5 mg by mouth daily.    [provider]  ezetimibe (ZETIA) 10 MG tablet Take 10 mg by mouth daily.    [provider]  fluticasone (FLONASE) 50 MCG/ACT nasal spray Place 1 spray into both nostrils daily as needed for allergies or rhinitis.    [provider]  Fluticasone-Salmeterol (ADVAIR) 500-50 MCG/DOSE AEPB Inhale 1 puff into the lungs 2 (two) times daily. Patient not taking: Reported on 12/15/2019 10/10/17   [provider]  lisinopril (PRINIVIL,ZESTRIL) 5 MG tablet Take 1 tablet (5 mg total) by mouth daily. 02/17/18   Lars Masson, MD  metoprolol tartrate (LOPRESSOR) 50 MG tablet Take 50 mg by mouth 2 (two) times daily.    [provider]  naproxen sodium (ALEVE) 220 MG tablet Take 220 mg by mouth 2 (two) times daily as needed (Pain).    [provider]  nitroGLYCERIN (NITROSTAT) 0.3 MG SL tablet Place 0.3 mg under the tongue every 5 (five) minutes as needed for chest pain.    [provider]  nystatin  (MYCOSTATIN/NYSTOP) powder Apply 1 application topically 2 (two) times daily as needed (Rash).    [provider]  ondansetron (ZOFRAN ODT) 4 MG disintegrating tablet Take 1 tablet (4 mg total) by mouth every 6 (six) hours as needed. 12/15/19   Ward, Layla Maw, DO  oxybutynin (DITROPAN) 5 MG tablet Take 5 mg by mouth every morning.    [provider]  oxyCODONE-acetaminophen (PERCOCET/ROXICET) 5-325 MG tablet Take 1 tablet by mouth every 4 (four) hours as needed. 12/15/19   Ward, Layla Maw, DO  PARoxetine (PAXIL) 40 MG tablet Take 40 mg by mouth daily. 10/07/19   [provider]  spironolactone (ALDACTONE) 25 MG tablet Take 0.5 tablets (12.5 mg total) by mouth daily. Patient taking differently: Take 50 mg by mouth daily.  02/17/18   Lars Masson, MD  venlafaxine XR (EFFEXOR-XR) 37.5 MG 24 hr capsule Take 37.5 mg by mouth daily with breakfast.    [provider]    Physical  Exam: Vitals:   02/08/20 0330 02/08/20 0400 02/08/20 0430 02/08/20 0500  BP: (!) 154/84 (!) 139/51 129/67 (!) 142/72  Pulse: 66 79 73 71  Resp: (!) 22 16 (!) 21 19  Temp:      TempSrc:      SpO2: 97% 93% 98% 97%  Weight:      Height:        Constitutional: NAD, calm, comfortable Eyes: PERRL, lids and conjunctivae normal ENMT: Mucous membranes are moist. Posterior pharynx clear of any exudate or lesions. Neck: normal, supple, no masses, no thyromegaly Respiratory: clear to auscultation bilaterally, no wheezing, no crackles. Normal respiratory effort. No accessory muscle use.  Cardiovascular: Regular rate and rhythm, no murmurs / rubs / gallops. No extremity edema. 2+ pedal pulses. No carotid bruits.  Abdomen: Obese, nondistended.  Positive right flank tenderness, no guarding or rebound, masses palpated. No hepatosplenomegaly. Bowel sounds positive.  Musculoskeletal: no clubbing / cyanosis. No joint deformity upper and lower extremities. Good ROM, no contractures. Normal muscle tone.    Skin: no rashes, lesions, ulcers. No induration Neurologic: CN 2-12 grossly intact. Sensation intact, DTR normal. Strength 5/5 in all 4.  Psychiatric: Normal judgment and insight. Alert and oriented x 3. Normal mood.   Labs on Admission: I have personally reviewed following labs and imaging studies  CBC: Recent Labs  Lab 02/07/20 1357  WBC 13.1*  NEUTROABS 9.9*  HGB 13.9  HCT 43.7  MCV 92.6  PLT 387    Basic Metabolic Panel: Recent Labs  Lab 02/07/20 1357  NA 128*  K 5.4*  CL 102  CO2 13*  GLUCOSE 94  BUN 72*  CREATININE 4.35*  CALCIUM 9.2    GFR: Estimated Creatinine Clearance: 11.5 mL/min (A) (by C-G formula based on SCr of 4.35 mg/dL (H)).  Liver Function Tests: Recent Labs  Lab 02/07/20 1357  AST 40  ALT 54*  ALKPHOS 116  BILITOT 0.7  PROT 8.7*  ALBUMIN 3.2*    Urine analysis:    Component Value Date/Time   COLORURINE YELLOW 02/07/2020 1855   APPEARANCEUR CLEAR 02/07/2020 1855   LABSPEC 1.012 02/07/2020 1855   PHURINE 5.0 02/07/2020 1855   GLUCOSEU NEGATIVE 02/07/2020 1855   HGBUR MODERATE (A) 02/07/2020 1855   BILIRUBINUR NEGATIVE 02/07/2020 1855   KETONESUR NEGATIVE 02/07/2020 1855   PROTEINUR NEGATIVE 02/07/2020 1855   NITRITE NEGATIVE 02/07/2020 1855   LEUKOCYTESUR LARGE (A) 02/07/2020 1855    Radiological Exams on Admission: CT Renal Stone Study  Result Date: 02/08/2020 CLINICAL DATA:  Acute renal failure, emesis, hematuria EXAM: CT ABDOMEN AND PELVIS WITHOUT CONTRAST TECHNIQUE: Multidetector CT imaging of the abdomen and pelvis was performed following the standard protocol without IV contrast. COMPARISON:  12/15/2019 FINDINGS: Lower chest: The visualized lung bases are clear bilaterally. Extensive coronary artery calcification within the right coronary artery. Extensive calcification of the mitral valve annulus. Global cardiac size within normal limits. Hepatobiliary: Tiny cyst within the right hepatic dome. Liver and gallbladder are  otherwise unremarkable. No intra or extrahepatic biliary ductal dilation. Pancreas: Unremarkable Spleen: Unremarkable Adrenals/Urinary Tract: The adrenal glands are unremarkable. There is moderate asymmetric cortical atrophy of the left kidney. The right kidney is normal in size and position. There is moderate right hydronephrosis again identified with marked hydroureter to the level of the distal right ureter where, similar to prior examination, there is intraluminal hyperdensity. This could reflect calcific debris within the distal right ureter, though its persistent since remote prior examination raises the question of an underlying  stricture. Additional considerations such as dystrophic calcification secondary to prior intervention or trauma, or metaplastic calcification within a neoplasm are unusual. The bladder is decompressed. No hydronephrosis is seen on the left. Stomach/Bowel: Stomach, small bowel, and large bowel are unremarkable. Appendix normal. No free intraperitoneal gas or fluid. Vascular/Lymphatic: There is extensive aortoiliac atherosclerotic calcification without evidence of aneurysm. Particularly prominent calcification is seen at the origin of the mesenteric vessels, though the degree of stenosis is not well assessed on this non arteriographic examination. No pathologic adenopathy within the abdomen and pelvis. Reproductive: The uterus is absent. No adnexal masses. Radiopacities seen within the pelvic floor posterior to the pubic symphysis may relate to bladder suspension surgery. Other: The rectum is unremarkable. Musculoskeletal: Degenerative changes are seen within the lumbar spine. No lytic or blastic bone lesions are seen. IMPRESSION: 1. Persistent moderate right hydronephrosis and hydroureter to the level of the distal right ureter, where there is intraluminal hyperdensity. This could reflect calcific debris within the distal right ureter, though its persistent since remote prior  examination raises the question of an underlying stricture. Additional considerations such as dystrophic calcification secondary to prior intervention or trauma, or metaplastic calcification within a neoplasm are unusual. 2. Extensive aortoiliac atherosclerotic calcification. Particularly prominent calcification is seen at the origin of the mesenteric vessels, though the degree of stenosis is not well assessed on this non arteriographic examination. Aortic Atherosclerosis (ICD10-I70.0). Electronically Signed   By: Helyn NumbersAshesh  Parikh MD   On: 02/08/2020 02:38    EKG: Independently reviewed.   Assessment/Plan Principal Problem:   Hydronephrosis of right kidney Observation/telemetry. Analgesics as needed. Keep n.p.o. Continue IV fluids. Monitor renal function electrolytes. Monitor intake and output. Will consult urology later today. [facility].  Active Problems:   Hyponatremia Continue normal saline infusion. Follow-up sodium level.    Hyperkalemia Continue IV hydration. Hold ACE inhibitor. Hold the spironolactone. Follow-up potassium level.    HTN (hypertension) Hold lisinopril and diuretic. Continue metoprolol after med rec performed.    Mixed hyperlipidemia Continue atorvastatin after dose confirmed.    Smoking Smoking cessation advised. NicoDerm 21 mg patch daily. Staff to provide tobacco cessation information.    COPD (chronic obstructive pulmonary disease) (HCC) Supplemental oxygen as needed. Bronchodilators as needed.   DVT prophylaxis: SCDs. Code Status:   Full code. Family Communication: Disposition Plan:   Patient is from:  Home.  Anticipated DC to:  Home.  Anticipated DC date:  02/09/2027.  Anticipated DC barriers: Clinical status and urology input.  Consults called:  Will need urology consult later today. Admission status:  Observation/telemetry.  Severity of Illness:   Bobette Moavid Manuel Jadaya Sommerfield MD [facility]  How to contact  the [facility] Attending or Consulting provider 7A - 7P or covering provider during after hours 7P -7A, for this patient?   1. Check the care team in [facility] and look for a) attending/consulting [facility] provider listed and b) the [facility] team listed 2. Log into [email or web address] and use [place]'s universal password to access. If you do not have the password, please contact the hospital operator. 3. Locate the [facility] provider you are looking for under [facility] and page to a number that you can be directly reached. 4. If you still have difficulty reaching the provider, please page the [facility] (Director on Call) for the Hospitalists listed on amion for assistance.  02/08/2020, 5:39 AM   This document was prepared using Dragon voice recognition software and may contain some unintended transcription errors.

## 2020-02-08 NOTE — ED Notes (Signed)
Pt ambulated to bathroom at this time. Given UA specimen cup.

## 2020-02-08 NOTE — Progress Notes (Signed)
Assumed care of patient from previous nurse. Agree with previous nurses assessment. Will continue to monitor.  

## 2020-02-08 NOTE — ED Provider Notes (Signed)
[facility] EMERGENCY DEPARTMENT Provider Note   CSN: [id number] Arrival date & time: 02/07/20  1248   History Chief Complaint  Patient presents with   Abnormal Lab    Jenna [name] is a 69 y.o. female.  The history is provided by the patient.  She has history of hypertension, COPD, coronary artery disease, diastolic heart failure and comes in because of an elevated creatinine.  Patient is an extremely poor historian, but apparently she had been seen in the ED in early July at which time she was noted to have an elevated creatinine and CT scan which showed bilateral hydronephrosis.  She was put on cefdinir and referred to urology for follow-up.  She never followed up with urology stating that, when she called the office, they did not know anything about her and that they were requiring an upfront payment which she could not afford and the appointment was not going to be for 2 months.  Since then, she has continued to have waxing and waning right flank pain which sometimes radiates over to the left.  There has been waxing and waning nausea but she has been able to continue to eat eat and drink.  She has not noticed any change in her urination.  She denies fever or chills.  She had blood drawn at her primary care office which showed a further increase in creatinine and was told to come to the emergency department.  Past Medical History:  Diagnosis Date   Anxiety    Arthritis    Chest pain 09/2017   COPD (chronic obstructive pulmonary disease) (HCC)    Depression    HTN (hypertension)    Obesity, Class III, BMI 40-49.9 (morbid obesity) ([place])    Sleep apnea    history of sleep apnea   Smoking     Patient Active Problem List   Diagnosis Date Noted   CAD (coronary artery disease) 04/18/2018   Chronic diastolic CHF (congestive heart failure) ([place]) 04/18/2018   Obesity due to excess calories 10/12/2017   NSTEMI (non-ST elevated myocardial infarction)  [facility])    Mixed hyperlipidemia    Unstable angina pectoris ([place])    Family history of early CAD    Smoking    Unstable angina ([place]) 09/26/2017   HTN (hypertension) 09/26/2017    Past Surgical History:  Procedure Laterality Date   ABDOMINAL HYSTERECTOMY     BREAST LUMPECTOMY     CARPAL TUNNEL RELEASE     bilteral   CERVICAL DISC SURGERY     COLONOSCOPY  07/2017   HEMORROIDECTOMY     LEFT HEART CATH AND CORONARY ANGIOGRAPHY N/A 09/27/2017   Procedure: LEFT HEART CATH AND CORONARY ANGIOGRAPHY;  Surgeon: Troy Sine, MD;  Location: [place] [facility];  Service: Cardiovascular;  Laterality: N/A;   TONSILLECTOMY       OB History   No obstetric history on file.     Family History  Problem Relation Age of Onset   Heart attack Father 62   Heart attack Sister 32       s/p PPM after a "major heart attack"   CAD Son 45    Social History   Tobacco Use   Smoking status: Current Every Day Smoker    Packs/day: 1.00    Types: Cigarettes   Smokeless tobacco: Never Used   Tobacco comment: 1PPD since age 26  Vaping Use   Vaping Use: Former  Substance Use Topics   Alcohol use: Yes  Comment: rare   Drug use: Not Currently    Home Medications Prior to Admission medications   Medication Sig Start Date End Date Taking? Authorizing Provider  albuterol (PROVENTIL HFA;VENTOLIN HFA) 108 (90 Base) MCG/ACT inhaler Inhale 2 puffs into the lungs every 4 (four) hours as needed for wheezing or shortness of breath.  09/21/16   [provider]  albuterol (PROVENTIL) (2.5 MG/3ML) 0.083% nebulizer solution Take 2.5 mg by nebulization every 6 (six) hours as needed for wheezing or shortness of breath.    [provider]  aspirin 81 MG chewable tablet Chew 1 tablet (81 mg total) by mouth daily. Patient not taking: Reported on 12/15/2019 09/29/17   Dana Allan I, MD  atorvastatin (LIPITOR) 80 MG tablet Take 1 tablet (80 mg total) by mouth daily at 6 PM.  09/28/17   Bonnell Public, MD  cefdinir (OMNICEF) 300 MG capsule Take 1 capsule (300 mg total) by mouth 2 (two) times daily. 12/15/19   Ward, Delice Bison, DO  cetirizine (ZYRTEC) 10 MG tablet Take 10 mg by mouth every morning.    [provider]  dapagliflozin propanediol (FARXIGA) 5 MG TABS tablet Take 5 mg by mouth daily.    [provider]  ezetimibe (ZETIA) 10 MG tablet Take 10 mg by mouth daily.    [provider]  fluticasone (FLONASE) 50 MCG/ACT nasal spray Place 1 spray into both nostrils daily as needed for allergies or rhinitis.    [provider]  Fluticasone-Salmeterol (ADVAIR) 500-50 MCG/DOSE AEPB Inhale 1 puff into the lungs 2 (two) times daily. Patient not taking: Reported on 12/15/2019 10/10/17   [provider]  lisinopril (PRINIVIL,ZESTRIL) 5 MG tablet Take 1 tablet (5 mg total) by mouth daily. 02/17/18   Dorothy Spark, MD  metoprolol tartrate (LOPRESSOR) 50 MG tablet Take 50 mg by mouth 2 (two) times daily.    [provider]  naproxen sodium (ALEVE) 220 MG tablet Take 220 mg by mouth 2 (two) times daily as needed (Pain).    [provider]  nitroGLYCERIN (NITROSTAT) 0.3 MG SL tablet Place 0.3 mg under the tongue every 5 (five) minutes as needed for chest pain.    [provider]  nystatin (MYCOSTATIN/NYSTOP) powder Apply 1 application topically 2 (two) times daily as needed (Rash).    [provider]  ondansetron (ZOFRAN ODT) 4 MG disintegrating tablet Take 1 tablet (4 mg total) by mouth every 6 (six) hours as needed. 12/15/19   Ward, Delice Bison, DO  oxybutynin (DITROPAN) 5 MG tablet Take 5 mg by mouth every morning.    [provider]  oxyCODONE-acetaminophen (PERCOCET/ROXICET) 5-325 MG tablet Take 1 tablet by mouth every 4 (four) hours as needed. 12/15/19   Ward, Delice Bison, DO  PARoxetine (PAXIL) 40 MG tablet Take 40 mg by mouth daily. 10/07/19   [provider]  spironolactone  (ALDACTONE) 25 MG tablet Take 0.5 tablets (12.5 mg total) by mouth daily. Patient taking differently: Take 50 mg by mouth daily.  02/17/18   Dorothy Spark, MD  venlafaxine XR (EFFEXOR-XR) 37.5 MG 24 hr capsule Take 37.5 mg by mouth daily with breakfast.    [provider]    Allergies    Madelaine Bhat isothiocyanate] and Hydrochlorothiazide  Review of Systems   Review of Systems  All other systems reviewed and are negative.   Physical Exam Updated Vital Signs BP 126/73 (BP Location: Right Arm)    Pulse 68    Temp Marland Kitchen)  97.5 F (36.4 C) (Oral)    Resp 16    LMP  (LMP Unknown)    SpO2 97%   Physical Exam Vitals and nursing note reviewed.   69 year old female, resting comfortably and in no acute distress. Vital signs are normal. Oxygen saturation is 97%, which is normal. Head is normocephalic and atraumatic. PERRLA, EOMI. Oropharynx is clear. Neck is nontender and supple without adenopathy or JVD. Back is nontender and there is no CVA tenderness. Lungs are clear without rales, wheezes, or rhonchi. Chest is nontender. Heart has regular rate and rhythm without murmur. Abdomen is soft, flat, nontender without masses or hepatosplenomegaly and peristalsis is normoactive. Extremities have no cyanosis or edema, full range of motion is present. Skin is warm and dry without rash. Neurologic: Mental status is normal, cranial nerves are intact, there are no motor or sensory deficits.  ED Results / Procedures / Treatments   Labs (all labs ordered are listed, but only abnormal results are displayed) Labs Reviewed  CBC WITH DIFFERENTIAL/PLATELET - Abnormal; Notable for the following components:      Result Value   WBC 13.1 (*)    Neutro Abs 9.9 (*)    Abs Immature Granulocytes 0.09 (*)    All other components within normal limits  COMPREHENSIVE METABOLIC PANEL - Abnormal; Notable for the following components:   Sodium 128 (*)    Potassium 5.4 (*)    CO2 13 (*)    BUN 72 (*)      Creatinine, Ser 4.35 (*)    Total Protein 8.7 (*)    Albumin 3.2 (*)    ALT 54 (*)    GFR calc non Af Amer 10 (*)    GFR calc Af Amer 11 (*)    All other components within normal limits  URINALYSIS, ROUTINE W REFLEX MICROSCOPIC - Abnormal; Notable for the following components:   Hgb urine dipstick MODERATE (*)    Leukocytes,Ua LARGE (*)    Bacteria, UA RARE (*)    All other components within normal limits   Radiology CT Renal Stone Study  Result Date: 02/08/2020 CLINICAL DATA:  Acute renal failure, emesis, hematuria EXAM: CT ABDOMEN AND PELVIS WITHOUT CONTRAST TECHNIQUE: Multidetector CT imaging of the abdomen and pelvis was performed following the standard protocol without IV contrast. COMPARISON:  12/15/2019 FINDINGS: Lower chest: The visualized lung bases are clear bilaterally. Extensive coronary artery calcification within the right coronary artery. Extensive calcification of the mitral valve annulus. Global cardiac size within normal limits. Hepatobiliary: Tiny cyst within the right hepatic dome. Liver and gallbladder are otherwise unremarkable. No intra or extrahepatic biliary ductal dilation. Pancreas: Unremarkable Spleen: Unremarkable Adrenals/Urinary Tract: The adrenal glands are unremarkable. There is moderate asymmetric cortical atrophy of the left kidney. The right kidney is normal in size and position. There is moderate right hydronephrosis again identified with marked hydroureter to the level of the distal right ureter where, similar to prior examination, there is intraluminal hyperdensity. This could reflect calcific debris within the distal right ureter, though its persistent since remote prior examination raises the question of an underlying stricture. Additional considerations such as dystrophic calcification secondary to prior intervention or trauma, or metaplastic calcification within a neoplasm are unusual. The bladder is decompressed. No hydronephrosis is seen on the left.  Stomach/Bowel: Stomach, small bowel, and large bowel are unremarkable. Appendix normal. No free intraperitoneal gas or fluid. Vascular/Lymphatic: There is extensive aortoiliac atherosclerotic calcification without evidence of aneurysm. Particularly prominent calcification is seen at the  origin of the mesenteric vessels, though the degree of stenosis is not well assessed on this non arteriographic examination. No pathologic adenopathy within the abdomen and pelvis. Reproductive: The uterus is absent. No adnexal masses. Radiopacities seen within the pelvic floor posterior to the pubic symphysis may relate to bladder suspension surgery. Other: The rectum is unremarkable. Musculoskeletal: Degenerative changes are seen within the lumbar spine. No lytic or blastic bone lesions are seen. IMPRESSION: 1. Persistent moderate right hydronephrosis and hydroureter to the level of the distal right ureter, where there is intraluminal hyperdensity. This could reflect calcific debris within the distal right ureter, though its persistent since remote prior examination raises the question of an underlying stricture. Additional considerations such as dystrophic calcification secondary to prior intervention or trauma, or metaplastic calcification within a neoplasm are unusual. 2. Extensive aortoiliac atherosclerotic calcification. Particularly prominent calcification is seen at the origin of the mesenteric vessels, though the degree of stenosis is not well assessed on this non arteriographic examination. Aortic Atherosclerosis (ICD10-I70.0). Electronically Signed   By: Fidela Salisbury MD   On: 02/08/2020 02:38    Procedures Procedures   Medications Ordered in ED Medications - No data to display  ED Course  I have reviewed the triage vital signs and the nursing notes.  Pertinent labs & imaging results that were available during my care of the patient were reviewed by me and considered in my medical decision making (see chart  for details).  MDM Rules/Calculators/A&P Worsening acute renal failure.  Old records are reviewed, and creatinine on 7/2 was 1.49, increased to 4.35 today.  There is also mild hypokalemia and mild hyponatremia which are not felt to be clinically significant, trivial elevation of ALT which is also not felt to be clinically significant.  Total protein is elevated with a low albumin raising concern for possible multiple myeloma.  On Care Everywhere, creatinine was noted to be 0.86 on 1/20, 3.85 on 8/18, 4.35 on 8/25.  Total protein has been progressively rising from 7.1 on 1/20 to 8.0 on 8/25.  In addition, she is taking lisinopril, which can worsen renal failure.  Renal stone protocol CT scan on 7/3 showed mild left and moderate right hydronephrosis with ill-defined density in the distal 2 cm of the right ureter.  Urinalysis today does show significant pyuria with only rare bacteria.  Will send for culture.  We will repeat renal stone protocol CT scan.  She will need to be admitted for further evaluation and treatment of her renal failure.  CT scan shows significant right hydronephrosis.  Left kidney appears somewhat atrophic.  Case is discussed with Dr. Olevia Bowens of [facility], who agrees to admit the patient.  Final Clinical Impression(s) / ED Diagnoses Final diagnoses:  Acute kidney injury (nontraumatic) (HCC)  Hydronephrosis of right kidney  Hyponatremia  Hyperkalemia  Elevated ALT measurement  Elevated total protein    Rx / DC Orders ED Discharge Orders    None       Delora Fuel, MD 16/10/96 [phone]

## 2020-02-08 NOTE — Transfer of Care (Signed)
Immediate Anesthesia Transfer of Care Note  Patient: Jenna [name]  Procedure(s) Performed: Procedure(s): CYSTOSCOPY WITH RETROGRADE PYELOGRAM/URETERAL STENT PLACEMENT (Right)  Patient Location: PACU  Anesthesia Type:General  Level of Consciousness: Alert, Awake, Oriented  Airway & Oxygen Therapy: Patient Spontanous Breathing  Post-op Assessment: Report given to RN  Post vital signs: Reviewed and stable  Last Vitals:  Vitals:   02/08/20 1228 02/08/20 1559  BP: (!) 155/64 (!) 156/78  Pulse: 72 79  Resp: (!) 21 20  Temp: 36.6 C 36.4 C  SpO2: 100% 99%    Complications: No apparent anesthesia complications

## 2020-02-08 NOTE — Op Note (Signed)
Procedure: 1.  Cystoscopy with right retrograde pyelogram and interpretation. 2.  Cystoscopy with insertion of right double-J stent. 3.  Application of fluoroscopy.  Preop diagnosis: AKI with right hydronephrosis and possible right distal stone.  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  Surgeon: Dr. Bjorn Pippin.  Anesthesia: General.  Specimen: Urine culture.  Drains: 6 French by 22 cm right contour double-J stent and 16 French Foley catheter.  EBL: None.  Complications: None.  Indications: The patient is a 69 year old female with AKI with a creatinine of 4.18 and right hydronephrosis with a possible right distal ureteral stone that was initially found on CT on July 2 but she failed follow-up she is to undergo cystoscopy with retrograde pyelography and stent.  Procedure: She was given 2 g of Ancef.  A general anesthetic was induced.  She was placed in lithotomy position and fitted with PAS hose.  Her perineum and genitalia were prepped with Betadine solution and she was draped in usual sterile fashion.  Cystoscopy was performed using a 23 [place] scope and 30 degree lens.  Examination revealed a normal urethra.  The bladder wall was smooth and pale but there was extensive changes consistent with follicular cystitis primarily in the trigone.  The ureteral orifice on the left was unremarkable.  The ureteral orifice on the right was somewhat erythematous with some thick purulent material effluxing from the orifice.  The right ureteral orifice was cannulated with a 5 French opening catheter and contrast was instilled.  The initial right retrograde pyelogram demonstrated a lozenge like filling defect with low radiodensity with minimal reflux of contrast proximal to the filling defect.  A guidewire was then used to advance ureteral catheter by the filling defect in the more proximal distal ureter and additional contrast was instilled.  This demonstrated a  hydronephrotic ureter with additional filling defect in the distal ureter.  The sensor wire was then advanced to the kidney and the open-ended catheter was removed.  A 6 French by 22 cm contour double-J stent was easily passed to the kidney under fluoroscopic guidance.  The wire was removed, leaving a good coil in the kidney and a good coil in the bladder.  Once the stent was in place, there was brisk efflux of thick purulent urine.  A 16 French Foley catheter was placed and the balloon was filled with 10 mL of sterile water.  A specimen was obtained for culture.  The catheter was placed to straight drainage.  She was taken down from lithotomy position, her anesthetic was reversed and she was moved to recovery in stable condition.  There were no complications.

## 2020-02-08 NOTE — Consult Note (Signed)
Subjective: CC: Right hydro and AKI  Hx: Jenna [name] is a 69 yo female who I was asked to see by Dr. Waymon AmatoHongalgi for right hydronephrosis with distal ureteral obstruction and AKI with a Cr of 4.18 which is up from 1.49 on 12/14/19 and 0.91 on 04/19/18.Marland Kitchen.   She was seen previously in July and had a CT that showed right hydro but she failed f/u with Urology at that time.   She has had some hematuria and right flank pain. 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  She has not had recent UTI's or a history of stones.  The CT showed some calcifications in the distal ureter but the were not clearly ureteral stones and could be dystrophic calcifications.   She has about a 90 pack year smoking history.      ROS:  Review of Systems  Respiratory: Negative for shortness of breath.   Cardiovascular: Negative for chest pain.  Genitourinary: Positive for flank pain and hematuria.  All other systems reviewed and are negative.   Allergies  Allergen Reactions   Mustard [Allyl Isothiocyanate] Anaphylaxis   Hydrochlorothiazide Hives, Swelling and Other (See Comments)    "swells, turns red, feels like it's burning"   Latex Hives, Itching and Dermatitis    Past Medical History:  Diagnosis Date   Anxiety    Arthritis    Chest pain 09/2017   COPD (chronic obstructive pulmonary disease) (HCC)    Depression    HTN (hypertension)    Obesity, Class III, BMI 40-49.9 (morbid obesity) (HCC)    Sleep apnea    history of sleep apnea   Smoking     Past Surgical History:  Procedure Laterality Date   ABDOMINAL HYSTERECTOMY     BREAST LUMPECTOMY     CARPAL TUNNEL RELEASE     bilteral   CERVICAL DISC SURGERY     COLONOSCOPY  07/2017   HEMORROIDECTOMY     LEFT HEART CATH AND CORONARY ANGIOGRAPHY N/A 09/27/2017   Procedure: LEFT HEART CATH AND CORONARY ANGIOGRAPHY;  Surgeon: Lennette BihariKelly, Thomas A, MD;  Location: [facility];  Service: Cardiovascular;   Laterality: N/A;   TONSILLECTOMY      Social History   Socioeconomic History   Marital status: Divorced    Spouse name: Not on file   Number of children: Not on file   Years of education: Not on file   Highest education level: Not on file  Occupational History   Not on file  Tobacco Use   Smoking status: Current Every Day Smoker    Packs/day: 1.00    Types: Cigarettes   Smokeless tobacco: Never Used   Tobacco comment: 1PPD since age 69  Vaping Use   Vaping Use: Former  Substance and Sexual Activity   Alcohol use: Yes    Comment: rare   Drug use: Not Currently   Sexual activity: Not on file  Other Topics Concern   Not on file  Social History Narrative   Not on file   Social Determinants of Health   Financial Resource Strain:    Difficulty of Paying Living Expenses: Not on file  Food Insecurity:    Worried About Programme researcher, broadcasting/film/videounning Out of Food in the Last Year: Not on file   [employer] Out of Food in the Last Year: Not on file  Transportation Needs:    Lack of Transportation (Medical): Not on file   Lack of Transportation (Non-Medical): Not on file  Physical Activity:    Days of Exercise per Week: Not on file   Minutes of Exercise per Session: Not on file  Stress:    Feeling of Stress : Not on file  Social Connections:    Frequency of Communication with Friends and Family: Not on file   Frequency of Social Gatherings with Friends and Family: Not on file   Attends Religious Services: Not on file   Active Member of Clubs or Organizations: Not on file   Attends Banker Meetings: Not on file   Marital Status: Not on file  Intimate Partner Violence:    Fear of Current or Ex-Partner: Not on file   Emotionally Abused: Not on file   Physically Abused: Not on file   Sexually Abused: Not on file    Family History  Problem Relation Age of Onset   Heart attack Father 87   Heart attack Sister 92       s/p PPM after a "major heart attack"    CAD Son 61    Anti-infectives: Anti-infectives (From admission, onward)   Start     Dose/Rate Route Frequency Ordered Stop   02/08/20 1315  ceFAZolin (ANCEF) powder 2 g        2 g Other To Surgery 02/08/20 1217 02/09/20 1315      Current Facility-Administered Medications  Medication Dose Route Frequency Provider Last Rate Last Admin   0.9 %  sodium chloride infusion   Intravenous Continuous Bobette Mo, MD 100 mL/hr at 02/08/20 0554 Rate Verify at 02/08/20 0554   acetaminophen (TYLENOL) tablet 650 mg  650 mg Oral Q6H PRN Bobette Mo, MD       Or   acetaminophen (TYLENOL) suppository 650 mg  650 mg Rectal Q6H PRN Bobette Mo, MD       ceFAZolin (ANCEF) powder 2 g  2 g Other To OR Bjorn Pippin, MD       nicotine (NICODERM CQ - dosed in mg/24 hours) patch 21 mg  21 mg Transdermal Daily Bobette Mo, MD   21 mg at 02/08/20 0427   ondansetron [facility]) tablet 4 mg  4 mg Oral Q6H PRN Bobette Mo, MD       Or   ondansetron [facility]) injection 4 mg  4 mg Intravenous Q6H PRN Bobette Mo, MD       oxyCODONE (Oxy IR/ROXICODONE) immediate release tablet 5 mg  5 mg Oral Q4H PRN Eduard Clos, MD         Objective: Vital signs in last 24 hours: BP (!) 151/69 (BP Location: Left Arm)    Pulse 71    Temp 97.6 F (36.4 C) (Oral)    Resp 20    Ht 4\' 11"  (1.499 m)    Wt 81.6 kg    LMP  (LMP Unknown)    SpO2 98%    BMI 36.36 kg/m   Intake/Output from previous day: 08/26 0701 - 08/27 0700 In: 181.8 [I.V.:181.8] Out: -  Intake/Output this shift: Total I/O In: -  Out: 550 [Urine:550]   Physical Exam Vitals reviewed.  Constitutional:      Appearance: Normal appearance.  HENT:     Head: Normocephalic and atraumatic.     Mouth/Throat:     Comments: Poor dentition. Cardiovascular:     Rate and Rhythm: Normal rate and regular rhythm.     Heart sounds: Normal heart sounds.  Pulmonary:     Effort: Pulmonary effort is normal. No  respiratory distress.     Breath sounds: Normal breath sounds.  Abdominal:     Palpations: Abdomen is soft. There is no mass.     Tenderness: There is no abdominal tenderness.  Musculoskeletal:        General: No swelling or tenderness. Normal range of motion.     Cervical back: Normal range of motion and neck supple.  Skin:    General: Skin is warm and dry.  Neurological:     General: No focal deficit present.     Mental Status: She is alert and oriented to person, place, and time.  Psychiatric:        Mood and Affect: Mood normal.     Lab Results:  Results for orders placed or performed during the hospital encounter of 02/07/20 (from the past 24 hour(s))  CBC with Differential     Status: Abnormal   Collection Time: 02/07/20  1:57 PM  Result Value Ref Range   WBC 13.1 (H) 4.0 - 10.5 K/uL   RBC 4.72 3.87 - 5.11 MIL/uL   Hemoglobin 13.9 12.0 - 15.0 g/dL   HCT 62.1 36 - 46 %   MCV 92.6 80.0 - 100.0 fL   MCH 29.4 26.0 - 34.0 pg   MCHC 31.8 30.0 - 36.0 g/dL   RDW 30.8 65.7 - 84.6 %   Platelets 387 150 - 400 K/uL   nRBC 0.0 0.0 - 0.2 %   Neutrophils Relative % 75 %   Neutro Abs 9.9 (H) 1.7 - 7.7 K/uL   Lymphocytes Relative 16 %   Lymphs Abs 2.1 0.7 - 4.0 K/uL   Monocytes Relative 7 %   Monocytes Absolute 0.9 0 - 1 K/uL   Eosinophils Relative 1 %   Eosinophils Absolute 0.2 0 - 0 K/uL   Basophils Relative 0 %   Basophils Absolute 0.0 0 - 0 K/uL   Immature Granulocytes 1 %   Abs Immature Granulocytes 0.09 (H) 0.00 - 0.07 K/uL  Comprehensive metabolic panel     Status: Abnormal   Collection Time: 02/07/20  1:57 PM  Result Value Ref Range   Sodium 128 (L) 135 - 145 mmol/L   Potassium 5.4 (H) 3.5 - 5.1 mmol/L   Chloride 102 98 - 111 mmol/L   CO2 13 (L) 22 - 32 mmol/L   Glucose, Bld 94 70 - 99 mg/dL   BUN 72 (H) 8 - 23 mg/dL   Creatinine, Ser 9.62 (H) 0.44 - 1.00 mg/dL   Calcium 9.2 8.9 - 95.2 mg/dL   Total Protein 8.7 (H) 6.5 - 8.1 g/dL   Albumin 3.2 (L) 3.5 - 5.0 g/dL    AST 40 15 - 41 U/L   ALT 54 (H) 0 - 44 U/L   Alkaline Phosphatase 116 38 - 126 U/L   Total Bilirubin 0.7 0.3 - 1.2 mg/dL   GFR calc non Af Amer 10 (L) >60 mL/min   GFR calc Af Amer 11 (L) >60 mL/min   Anion gap 13 5 - 15  Urinalysis, Routine w reflex microscopic Urine, Clean Catch     Status: Abnormal   Collection Time: 02/07/20  6:55 PM  Result Value Ref Range   Color, Urine YELLOW YELLOW   APPearance CLEAR CLEAR   Specific Gravity, Urine 1.012 1.005 - 1.030   pH 5.0 5.0 - 8.0   Glucose, UA NEGATIVE NEGATIVE mg/dL   Hgb urine dipstick MODERATE (A) NEGATIVE   Bilirubin Urine NEGATIVE NEGATIVE   Ketones, ur NEGATIVE NEGATIVE  mg/dL   Protein, ur NEGATIVE NEGATIVE mg/dL   Nitrite NEGATIVE NEGATIVE   Leukocytes,Ua LARGE (A) NEGATIVE   RBC / HPF 0-5 0 - 5 RBC/hpf   WBC, UA 21-50 0 - 5 WBC/hpf   Bacteria, UA RARE (A) NONE SEEN   Squamous Epithelial / LPF 0-5 0 - 5  SARS Coronavirus 2 by RT PCR (hospital order, performed in [facility] hospital lab) Nasopharyngeal Nasopharyngeal Swab     Status: None   Collection Time: 02/08/20  1:14 AM   Specimen: Nasopharyngeal Swab  Result Value Ref Range   SARS Coronavirus 2 NEGATIVE NEGATIVE  Comprehensive metabolic panel     Status: Abnormal   Collection Time: 02/08/20  6:59 AM  Result Value Ref Range   Sodium 129 (L) 135 - 145 mmol/L   Potassium 4.9 3.5 - 5.1 mmol/L   Chloride 104 98 - 111 mmol/L   CO2 14 (L) 22 - 32 mmol/L   Glucose, Bld 120 (H) 70 - 99 mg/dL   BUN 76 (H) 8 - 23 mg/dL   Creatinine, Ser 6.43 (H) 0.44 - 1.00 mg/dL   Calcium 9.1 8.9 - 32.9 mg/dL   Total Protein 8.8 (H) 6.5 - 8.1 g/dL   Albumin 3.5 3.5 - 5.0 g/dL   AST 29 15 - 41 U/L   ALT 46 (H) 0 - 44 U/L   Alkaline Phosphatase 119 38 - 126 U/L   Total Bilirubin 0.3 0.3 - 1.2 mg/dL   GFR calc non Af Amer 10 (L) >60 mL/min   GFR calc Af Amer 12 (L) >60 mL/min   Anion gap 11 5 - 15  CBC     Status: Abnormal   Collection Time: 02/08/20  6:59 AM  Result Value Ref  Range   WBC 14.3 (H) 4.0 - 10.5 K/uL   RBC 4.72 3.87 - 5.11 MIL/uL   Hemoglobin 13.8 12.0 - 15.0 g/dL   HCT 51.8 36 - 46 %   MCV 89.4 80.0 - 100.0 fL   MCH 29.2 26.0 - 34.0 pg   MCHC 32.7 30.0 - 36.0 g/dL   RDW 84.1 66.0 - 63.0 %   Platelets 370 150 - 400 K/uL   nRBC 0.0 0.0 - 0.2 %    BMET Recent Labs    02/07/20 1357 02/08/20 0659  NA 128* 129*  K 5.4* 4.9  CL 102 104  CO2 13* 14*  GLUCOSE 94 120*  BUN 72* 76*  CREATININE 4.35* 4.18*  CALCIUM 9.2 9.1   PT/INR No results for input(s): LABPROT, INR in the last 72 hours. ABG No results for input(s): PHART, HCO3 in the last 72 hours.  Invalid input(s): PCO2, PO2  Studies/Results: CT Renal Stone Study  Result Date: 02/08/2020 CLINICAL DATA:  Acute renal failure, emesis, hematuria EXAM: CT ABDOMEN AND PELVIS WITHOUT CONTRAST TECHNIQUE: Multidetector CT imaging of the abdomen and pelvis was performed following the standard protocol without IV contrast. COMPARISON:  12/15/2019 FINDINGS: Lower chest: The visualized lung bases are clear bilaterally. Extensive coronary artery calcification within the right coronary artery. Extensive calcification of the mitral valve annulus. Global cardiac size within normal limits. Hepatobiliary: Tiny cyst within the right hepatic dome. Liver and gallbladder are otherwise unremarkable. No intra or extrahepatic biliary ductal dilation. Pancreas: Unremarkable Spleen: Unremarkable Adrenals/Urinary Tract: The adrenal glands are unremarkable. There is moderate asymmetric cortical atrophy of the left kidney. The right kidney is normal in size and position. There is moderate right hydronephrosis again identified with marked hydroureter to  the level of the distal right ureter where, similar to prior examination, there is intraluminal hyperdensity. This could reflect calcific debris within the distal right ureter, though its persistent since remote prior examination raises the question of an underlying stricture.  Additional considerations such as dystrophic calcification secondary to prior intervention or trauma, or metaplastic calcification within a neoplasm are unusual. The bladder is decompressed. No hydronephrosis is seen on the left. Stomach/Bowel: Stomach, small bowel, and large bowel are unremarkable. Appendix normal. No free intraperitoneal gas or fluid. Vascular/Lymphatic: There is extensive aortoiliac atherosclerotic calcification without evidence of aneurysm. Particularly prominent calcification is seen at the origin of the mesenteric vessels, though the degree of stenosis is not well assessed on this non arteriographic examination. No pathologic adenopathy within the abdomen and pelvis. Reproductive: The uterus is absent. No adnexal masses. Radiopacities seen within the pelvic floor posterior to the pubic symphysis may relate to bladder suspension surgery. Other: The rectum is unremarkable. Musculoskeletal: Degenerative changes are seen within the lumbar spine. No lytic or blastic bone lesions are seen. IMPRESSION: 1. Persistent moderate right hydronephrosis and hydroureter to the level of the distal right ureter, where there is intraluminal hyperdensity. This could reflect calcific debris within the distal right ureter, though its persistent since remote prior examination raises the question of an underlying stricture. Additional considerations such as dystrophic calcification secondary to prior intervention or trauma, or metaplastic calcification within a neoplasm are unusual. 2. Extensive aortoiliac atherosclerotic calcification. Particularly prominent calcification is seen at the origin of the mesenteric vessels, though the degree of stenosis is not well assessed on this non arteriographic examination. Aortic Atherosclerosis (ICD10-I70.0). Electronically Signed   By: Helyn Numbers MD   On: 02/08/2020 02:38     Assessment/Plan: Imp: 1. Right hydronephrosis with right distal ureteral obstruction of  uncertain etiology and AKI.             2. Left renal atrophy.           3. 90 pk year smoking history.  Urothelial neoplasm is a concern.   Plan:  I have Giovanni scheduled for cystoscopy with right retrograde pyelography and stent later this afternoon.    I have reviewed the risks of bleeding, infection, ureteral injury, inability to place a stent requiring a perc, thrombotic events and anesthetic complications.               CC: Dr . Marcellus Scott.     Bjorn Pippin 02/08/2020 [phone]

## 2020-02-09 ENCOUNTER — Encounter (HOSPITAL_COMMUNITY): Payer: Self-pay | Admitting: Urology

## 2020-02-09 DIAGNOSIS — N179 Acute kidney failure, unspecified: Principal | ICD-10-CM

## 2020-02-09 DIAGNOSIS — N189 Chronic kidney disease, unspecified: Secondary | ICD-10-CM

## 2020-02-09 LAB — BASIC METABOLIC PANEL
Anion gap: 10 (ref 5–15)
Anion gap: 10 (ref 5–15)
Anion gap: 9 (ref 5–15)
BUN: 61 mg/dL — ABNORMAL HIGH (ref 8–23)
BUN: 58 mg/dL — ABNORMAL HIGH (ref 8–23)
BUN: 56 mg/dL — ABNORMAL HIGH (ref 8–23)
CO2: 17 mmol/L — ABNORMAL LOW (ref 22–32)
CO2: 14 mmol/L — ABNORMAL LOW (ref 22–32)
CO2: 16 mmol/L — ABNORMAL LOW (ref 22–32)
Calcium: 8.6 mg/dL — ABNORMAL LOW (ref 8.9–10.3)
Calcium: 8.5 mg/dL — ABNORMAL LOW (ref 8.9–10.3)
Calcium: 8.4 mg/dL — ABNORMAL LOW (ref 8.9–10.3)
Chloride: 108 mmol/L (ref 98–111)
Chloride: 108 mmol/L (ref 98–111)
Chloride: 110 mmol/L (ref 98–111)
Creatinine, Ser: 3.37 mg/dL — ABNORMAL HIGH (ref 0.44–1.00)
Creatinine, Ser: 3.12 mg/dL — ABNORMAL HIGH (ref 0.44–1.00)
Creatinine, Ser: 3.08 mg/dL — ABNORMAL HIGH (ref 0.44–1.00)
GFR calc Af Amer: 15 mL/min — ABNORMAL LOW (ref >60)
GFR calc Af Amer: 17 mL/min — ABNORMAL LOW (ref >60)
GFR calc Af Amer: 17 mL/min — ABNORMAL LOW (ref >60)
GFR calc non Af Amer: 13 mL/min — ABNORMAL LOW (ref >60)
GFR calc non Af Amer: 15 mL/min — ABNORMAL LOW (ref >60)
GFR calc non Af Amer: 15 mL/min — ABNORMAL LOW (ref >60)
Glucose, Bld: 140 mg/dL — ABNORMAL HIGH (ref 70–99)
Glucose, Bld: 133 mg/dL — ABNORMAL HIGH (ref 70–99)
Glucose, Bld: 105 mg/dL — ABNORMAL HIGH (ref 70–99)
Potassium: 6 mmol/L — ABNORMAL HIGH (ref 3.5–5.1)
Potassium: 5.1 mmol/L (ref 3.5–5.1)
Potassium: 4.8 mmol/L (ref 3.5–5.1)
Sodium: 135 mmol/L (ref 135–145)
Sodium: 132 mmol/L — ABNORMAL LOW (ref 135–145)
Sodium: 135 mmol/L (ref 135–145)

## 2020-02-09 LAB — CBC
HCT: 35.6 % — ABNORMAL LOW (ref 36.0–46.0)
Hemoglobin: 11.6 g/dL — ABNORMAL LOW (ref 12.0–15.0)
MCH: 29.2 pg (ref 26.0–34.0)
MCHC: 32.6 g/dL (ref 30.0–36.0)
MCV: 89.7 fL (ref 80.0–100.0)
Platelets: 351 10*3/uL (ref 150–400)
RBC: 3.97 MIL/uL (ref 3.87–5.11)
RDW: 14.4 % (ref 11.5–15.5)
WBC: 14.9 10*3/uL — ABNORMAL HIGH (ref 4.0–10.5)
nRBC: 0 % (ref 0.0–0.2)

## 2020-02-09 LAB — HIV ANTIBODY (ROUTINE TESTING W REFLEX): HIV Screen 4th Generation wRfx: NONREACTIVE

## 2020-02-09 NOTE — Progress Notes (Signed)
PROGRESS NOTE   Jenna [name]  VHQ:[id number]    DOB: Jul 17, 1950    DOA: 02/07/2020  PCP: Ward, Clois ComberKimberly Dawn, FNP   I have briefly reviewed patients [facility].  Chief Complaint  Patient presents with  . Abnormal Lab    Brief Narrative:  69 year old female with PMH of anxiety, depression, COPD, HTN, OSA, tobacco abuse, CAD, referred to ED by PCP due to right flank pain and worsening renal failure.  She had been seen in the ED on 12/14/2019 for flank pain and gross hematuria, CT head showed moderate right-sided hydroureteronephrosis with ill-defined hyperdensity within the distal 2 cm of the right ureter and some mild left hydroureteronephrosis, recommended outpatient follow-up with urology which she was unable to do due to financial constraints.  Since then she has continued to have right flank pain but no dysuria or hematuria.  Admitted for acute on chronic kidney disease, persistent moderate right hydronephrosis and hydroureter with distal ureteral obstruction, hyperkalemia and metabolic acidosis.  Urology consulted and s/p cystoscopy with right retrograde pyelogram and insertion of right double-J stent 8/27.  Improving.   Assessment & Plan:  Principal Problem:   Hydronephrosis of right kidney Active Problems:   HTN (hypertension)   Mixed hyperlipidemia   Smoking   COPD (chronic obstructive pulmonary disease) (HCC)   Hyponatremia   Hyperkalemia   Acute renal failure superimposed on chronic kidney disease (HCC)   Acute renal failure complicating chronic kidney disease stage IIIb: Most recent creatinine on 7/2: 1.49.  Prior to that creatinine in 2019 was normal at 0.9.  Major component for her acute renal failure seems to be obstructive/right hydroureteronephrosis secondary to distal ureteral obstruction.  S/p right ureteral stent 8/27.  Continue IVF.  Creatinine slowly trending down/3.12 today.  Bicarbonate has dropped from 17-14 and if it persists on  this evening's labs, consider bicarbonate drip.  Avoid nephrotoxic's.  Right pyelonephrosis with probable right distal ureteral stone: Urology consulted and s/p cystoscopy with right retrograde pyelogram and insertion of right double-J stent 8/27. Urology follow-up appreciated.  Discussed with Dr. Annabell HowellsWrenn.  Discontinuing Foley catheter and she will need ureteroscopy in few weeks.  Urine culture with multiple species.  Continue IV ceftriaxone.  Hyperkalemia: Potassium 5.4 on admission.  Resolved after IV fluid hydration alone.  Hold ACEI/Aldactone.  Metabolic acidosis, NAG: Presented with bicarb of 13, likely related to acute renal failure.  Improved to 17 but down again to 14.  Treatment as above and follow BMP closely.  Hyponatremia:  Likely due to dehydration and acute renal failure.  Continue normal saline hydration and follow BMP closely.  Stable.  Essential hypertension: Reasonable control.  Hold ACEI, diuretics.  Dyslipidemia:  Continue atorvastatin.  Tobacco abuse: Cessation counseled.  Continue nicotine patch.  COPD: No clinical bronchospasm.  Body mass index is 36.33 kg/m./Obesity  Anemia: Could be dilutional.  Follow CBC in a.m.   DVT prophylaxis: SCD Code Status: Full Family Communication: None at bedside Disposition:  Status is: Observation  The patient will require care spanning > 2 midnights and should be moved to inpatient because: Persistent severe electrolyte disturbances, IV treatments appropriate due to intensity of illness or inability to take PO and Inpatient level of care appropriate due to severity of illness  Dispo: The patient is from: Home              Anticipated d/c is to: Home  Anticipated d/c date is: > 3 days              Patient currently is not medically stable to d/c.        Consultants:   Urology   Procedures:   s/p cystoscopy with right retrograde pyelogram and insertion of right double-J stent 8/27.    Antimicrobials:   IV ceftriaxone   Subjective:  Denies complaints.  Expectorant of going home.  No pain reported.  Objective:   Vitals:   02/09/20 0939 02/09/20 0942 02/09/20 0944 02/09/20 1249  BP: (!) 131/55 (!) 130/56 116/66 (!) 135/58  Pulse: 76 78 81 (!) 53  Resp: 19 17  20   Temp: 98.1 F (36.7 C) 98.1 F (36.7 C)  (!) 97.5 F (36.4 C)  TempSrc: Oral Oral  Oral  SpO2: 97% 97% 99% 100%  Weight:      Height:        General exam: Pleasant middle-aged female, moderately built and nourished lying comfortably propped up in bed.  Oral mucosa moist Respiratory system: Clear to auscultation. Respiratory effort normal. Cardiovascular system: S1 & S2 heard, RRR. No JVD, murmurs, rubs, gallops or clicks. No pedal edema.  Telemetry personally reviewed: Sinus rhythm Gastrointestinal system: Abdomen is nondistended, soft and nontender. No organomegaly or masses felt. Normal bowel sounds heard. Central nervous system: Alert and oriented. No focal neurological deficits. Extremities: Symmetric 5 x 5 power. Skin: No rashes, lesions or ulcers Psychiatry: Judgement and insight appear normal. Mood & affect appropriate.     Data Reviewed:   I have personally reviewed following labs and imaging studies   CBC: Recent Labs  Lab 02/07/20 1357 02/08/20 0659 02/09/20 0647  WBC 13.1* 14.3* 14.9*  NEUTROABS 9.9*  --   --   HGB 13.9 13.8 11.6*  HCT 43.7 42.2 35.6*  MCV 92.6 89.4 89.7  PLT 387 370 351    Basic Metabolic Panel: Recent Labs  Lab 02/08/20 1731 02/09/20 0042 02/09/20 0647  NA 133* 135 132*  K 5.7* 6.0* 5.1  CL 106 108 108  CO2 15* 17* 14*  GLUCOSE 92 140* 133*  BUN 67* 61* 58*  CREATININE 4.03* 3.37* 3.12*  CALCIUM 8.9 8.6* 8.5*    Liver Function Tests: Recent Labs  Lab 02/07/20 1357 02/08/20 0659  AST 40 29  ALT 54* 46*  ALKPHOS 116 119  BILITOT 0.7 0.3  PROT 8.7* 8.8*  ALBUMIN 3.2* 3.5    CBG: No results for input(s): GLUCAP in the last 168  hours.  Microbiology Studies:   Recent Results (from the past 240 hour(s))  SARS Coronavirus 2 by RT PCR (hospital order, performed in [facility] hospital lab) Nasopharyngeal Nasopharyngeal Swab     Status: None   Collection Time: 02/08/20  1:14 AM   Specimen: Nasopharyngeal Swab  Result Value Ref Range Status   SARS Coronavirus 2 NEGATIVE NEGATIVE Final    Comment: (NOTE) SARS-CoV-2 target nucleic acids are NOT DETECTED.  The SARS-CoV-2 RNA is generally detectable in upper and lower respiratory specimens during the acute phase of infection. The lowest concentration of SARS-CoV-2 viral copies this assay can detect is 250 copies / mL. A negative result does not preclude SARS-CoV-2 infection and should not be used as the sole basis for treatment or other patient management decisions.  A negative result may occur with improper specimen collection / handling, submission of specimen other than nasopharyngeal swab, presence of viral mutation(s) within the areas targeted by this assay, and inadequate number of  viral copies (<250 copies / mL). A negative result must be combined with clinical observations, patient history, and epidemiological information.  Fact Sheet for Patients:   BoilerBrush.com.cy  Fact Sheet for Healthcare Providers: https://pope.com/  This test is not yet approved or  cleared by the [place] FDA and has been authorized for detection and/or diagnosis of SARS-CoV-2 by FDA under an Emergency Use Authorization (EUA).  This EUA will remain in effect (meaning this test can be used) for the duration of the COVID-19 declaration under Section 564(b)(1) of the Act, 21 U.S.C. section 360bbb-3(b)(1), unless the authorization is terminated or revoked sooner.  Performed at [facility], [address] [place], [place] [address]   Urine culture     Status: Abnormal   Collection Time: 02/08/20  3:57 AM   Specimen:  Urine, Random  Result Value Ref Range Status   Specimen Description URINE, RANDOM  Final   Special Requests   Final    NONE Performed at [facility], [address] [place], [place] [address]    Culture MULTIPLE SPECIES PRESENT, SUGGEST RECOLLECTION (A)  Final   Report Status 02/08/2020 FINAL  Final     Radiology Studies:  DG C-Arm 1-60 Min-No Report  Result Date: 02/08/2020 Fluoroscopy was utilized by the requesting physician.  No radiographic interpretation.   CT Renal Stone Study  Result Date: 02/08/2020 CLINICAL DATA:  Acute renal failure, emesis, hematuria EXAM: CT ABDOMEN AND PELVIS WITHOUT CONTRAST TECHNIQUE: Multidetector CT imaging of the abdomen and pelvis was performed following the standard protocol without IV contrast. COMPARISON:  12/15/2019 FINDINGS: Lower chest: The visualized lung bases are clear bilaterally. Extensive coronary artery calcification within the right coronary artery. Extensive calcification of the mitral valve annulus. Global cardiac size within normal limits. Hepatobiliary: Tiny cyst within the right hepatic dome. Liver and gallbladder are otherwise unremarkable. No intra or extrahepatic biliary ductal dilation. Pancreas: Unremarkable Spleen: Unremarkable Adrenals/Urinary Tract: The adrenal glands are unremarkable. There is moderate asymmetric cortical atrophy of the left kidney. The right kidney is normal in size and position. There is moderate right hydronephrosis again identified with marked hydroureter to the level of the distal right ureter where, similar to prior examination, there is intraluminal hyperdensity. This could reflect calcific debris within the distal right ureter, though its persistent since remote prior examination raises the question of an underlying stricture. Additional considerations such as dystrophic calcification secondary to prior intervention or trauma, or metaplastic calcification within a neoplasm are unusual. The bladder is  decompressed. No hydronephrosis is seen on the left. Stomach/Bowel: Stomach, small bowel, and large bowel are unremarkable. Appendix normal. No free intraperitoneal gas or fluid. Vascular/Lymphatic: There is extensive aortoiliac atherosclerotic calcification without evidence of aneurysm. Particularly prominent calcification is seen at the origin of the mesenteric vessels, though the degree of stenosis is not well assessed on this non arteriographic examination. No pathologic adenopathy within the abdomen and pelvis. Reproductive: The uterus is absent. No adnexal masses. Radiopacities seen within the pelvic floor posterior to the pubic symphysis may relate to bladder suspension surgery. Other: The rectum is unremarkable. Musculoskeletal: Degenerative changes are seen within the lumbar spine. No lytic or blastic bone lesions are seen. IMPRESSION: 1. Persistent moderate right hydronephrosis and hydroureter to the level of the distal right ureter, where there is intraluminal hyperdensity. This could reflect calcific debris within the distal right ureter, though its persistent since remote prior examination raises the question of an underlying stricture. Additional considerations such as dystrophic  calcification secondary to prior intervention or trauma, or metaplastic calcification within a neoplasm are unusual. 2. Extensive aortoiliac atherosclerotic calcification. Particularly prominent calcification is seen at the origin of the mesenteric vessels, though the degree of stenosis is not well assessed on this non arteriographic examination. Aortic Atherosclerosis (ICD10-I70.0). Electronically Signed   By: Helyn Numbers MD   On: 02/08/2020 02:38     Scheduled Meds:   . atorvastatin  80 mg Oral q1800  . Chlorhexidine Gluconate Cloth  6 each Topical Daily  . ezetimibe  10 mg Oral Daily  . loratadine  10 mg Oral Daily  . metoprolol tartrate  50 mg Oral BID  . nicotine  21 mg Transdermal Daily  . oxybutynin  5 mg  Oral Daily  . venlafaxine XR  37.5 mg Oral Daily    Continuous Infusions:   . sodium chloride 100 mL/hr at 02/09/20 0513  . cefTRIAXone (ROCEPHIN)  IV 1 g (02/08/20 2027)     LOS: 1 day     Marcellus Scott, MD, [place], [facility]. [facility]    To contact the attending provider between 7A-7P or the covering provider during after hours 7P-7A, please log into the web site [email or web address] and access using universal [place] password for that web site. If you do not have the password, please call the hospital operator.  02/09/2020, 4:18 PM

## 2020-02-09 NOTE — Progress Notes (Signed)
1 Day Post-Op  Subjective: Jenna [name] is doing well today without pain.  Her urine is clearing.  She has no fever.   Cystoscopy and Retrograde demonstrated right pyonephrosis with a probable right distal stone which based on her prior culture in July which grew proteus is likely  Struvite which tends to be more radiolucent.    ROS:  Review of Systems  Constitutional: Negative for chills and fever.  Genitourinary: Negative for flank pain.    Anti-infectives: Anti-infectives (From admission, onward)   Start     Dose/Rate Route Frequency Ordered Stop   02/08/20 2000  cefTRIAXone (ROCEPHIN) 1 g in sodium chloride 0.9 % 100 mL IVPB        1 g 200 mL/hr over 30 Minutes Intravenous Every 24 hours 02/08/20 1817     02/08/20 1315  ceFAZolin (ANCEF) powder 2 g  Status:  Discontinued        2 g Other To Surgery 02/08/20 1217 02/08/20 1219   02/08/20 1220  ceFAZolin (ANCEF) IVPB 2g/100 mL premix        2 g 200 mL/hr over 30 Minutes Intravenous 60 min pre-op 02/08/20 1220 02/08/20 1643      Current Facility-Administered Medications  Medication Dose Route Frequency Provider Last Rate Last Admin  . 0.9 %  sodium chloride infusion   Intravenous Continuous Bobette Mo, MD 100 mL/hr at 02/09/20 0513 New Bag at 02/09/20 0513  . acetaminophen (TYLENOL) tablet 650 mg  650 mg Oral Q6H PRN Bobette Mo, MD       Or  . acetaminophen (TYLENOL) suppository 650 mg  650 mg Rectal Q6H PRN Bobette Mo, MD      . atorvastatin (LIPITOR) tablet 80 mg  80 mg Oral q1800 Bjorn Pippin, MD   80 mg at 02/08/20 2029  . cefTRIAXone (ROCEPHIN) 1 g in sodium chloride 0.9 % 100 mL IVPB  1 g Intravenous Q24H Bjorn Pippin, MD 200 mL/hr at 02/08/20 2027 1 g at 02/08/20 2027  . Chlorhexidine Gluconate Cloth 2 % PADS 6 each  6 each Topical Daily Elease Etienne, MD   6 each at 02/08/20 2028  . ezetimibe (ZETIA) tablet 10 mg  10 mg Oral Daily Bjorn Pippin, MD      . loratadine (CLARITIN) tablet 10 mg  10 mg Oral  Daily Bjorn Pippin, MD      . metoprolol tartrate (LOPRESSOR) tablet 50 mg  50 mg Oral BID Elease Etienne, MD   50 mg at 02/08/20 2139  . nicotine (NICODERM CQ - dosed in mg/24 hours) patch 21 mg  21 mg Transdermal Daily Bobette Mo, MD   21 mg at 02/08/20 0427  . nitroGLYCERIN (NITROSTAT) SL tablet 0.4 mg  0.4 mg Sublingual Q5 min PRN Bjorn Pippin, MD      . ondansetron [facility]) tablet 4 mg  4 mg Oral Q6H PRN Bobette Mo, MD       Or  . ondansetron [facility]) injection 4 mg  4 mg Intravenous Q6H PRN Bobette Mo, MD   4 mg at 02/08/20 1658  . oxybutynin (DITROPAN) tablet 5 mg  5 mg Oral Daily Hongalgi, Anand D, MD      . oxyCODONE (Oxy IR/ROXICODONE) immediate release tablet 5 mg  5 mg Oral Q4H PRN Bobette Mo, MD      . venlafaxine XR [facility]) 24 hr capsule 37.5 mg  37.5 mg Oral Daily Hongalgi, Maximino Greenland, MD  Objective: Vital signs in last 24 hours: Temp:  [97.4 F (36.3 C)-97.9 F (36.6 C)] 97.6 F (36.4 C) (08/28 0529) Pulse Rate:  [66-83] 66 (08/28 0529) Resp:  [14-21] 18 (08/28 0529) BP: (123-156)/(48-78) 140/60 (08/28 0529) SpO2:  [95 %-100 %] 95 % (08/28 0529) Weight:  [81.6 kg] 81.6 kg (08/27 1546)  Intake/Output from previous day: 08/27 0701 - 08/28 0700 In: 1146.6 [I.V.:1046.6; IV Piggyback:100] Out: 1950 [Urine:1950] Intake/Output this shift: No intake/output data recorded.   Physical Exam Vitals reviewed.  Constitutional:      Appearance: Normal appearance.  Neurological:     Mental Status: She is alert.     Lab Results:  Recent Labs    02/08/20 0659 02/09/20 0647  WBC 14.3* 14.9*  HGB 13.8 11.6*  HCT 42.2 35.6*  PLT 370 351   BMET Recent Labs    02/09/20 0042 02/09/20 0647  NA 135 132*  K 6.0* 5.1  CL 108 108  CO2 17* 14*  GLUCOSE 140* 133*  BUN 61* 58*  CREATININE 3.37* 3.12*  CALCIUM 8.6* 8.5*   PT/INR No results for input(s): LABPROT, INR in the last 72 hours. ABG No results for input(s):  PHART, HCO3 in the last 72 hours.  Invalid input(s): PCO2, PO2  Studies/Results: DG C-Arm 1-60 Min-No Report  Result Date: 02/08/2020 Fluoroscopy was utilized by the requesting physician.  No radiographic interpretation.   CT Renal Stone Study  Result Date: 02/08/2020 CLINICAL DATA:  Acute renal failure, emesis, hematuria EXAM: CT ABDOMEN AND PELVIS WITHOUT CONTRAST TECHNIQUE: Multidetector CT imaging of the abdomen and pelvis was performed following the standard protocol without IV contrast. COMPARISON:  12/15/2019 FINDINGS: Lower chest: The visualized lung bases are clear bilaterally. Extensive coronary artery calcification within the right coronary artery. Extensive calcification of the mitral valve annulus. Global cardiac size within normal limits. Hepatobiliary: Tiny cyst within the right hepatic dome. Liver and gallbladder are otherwise unremarkable. No intra or extrahepatic biliary ductal dilation. Pancreas: Unremarkable Spleen: Unremarkable Adrenals/Urinary Tract: The adrenal glands are unremarkable. There is moderate asymmetric cortical atrophy of the left kidney. The right kidney is normal in size and position. There is moderate right hydronephrosis again identified with marked hydroureter to the level of the distal right ureter where, similar to prior examination, there is intraluminal hyperdensity. This could reflect calcific debris within the distal right ureter, though its persistent since remote prior examination raises the question of an underlying stricture. Additional considerations such as dystrophic calcification secondary to prior intervention or trauma, or metaplastic calcification within a neoplasm are unusual. The bladder is decompressed. No hydronephrosis is seen on the left. Stomach/Bowel: Stomach, small bowel, and large bowel are unremarkable. Appendix normal. No free intraperitoneal gas or fluid. Vascular/Lymphatic: There is extensive aortoiliac atherosclerotic calcification  without evidence of aneurysm. Particularly prominent calcification is seen at the origin of the mesenteric vessels, though the degree of stenosis is not well assessed on this non arteriographic examination. No pathologic adenopathy within the abdomen and pelvis. Reproductive: The uterus is absent. No adnexal masses. Radiopacities seen within the pelvic floor posterior to the pubic symphysis may relate to bladder suspension surgery. Other: The rectum is unremarkable. Musculoskeletal: Degenerative changes are seen within the lumbar spine. No lytic or blastic bone lesions are seen. IMPRESSION: 1. Persistent moderate right hydronephrosis and hydroureter to the level of the distal right ureter, where there is intraluminal hyperdensity. This could reflect calcific debris within the distal right ureter, though its persistent since remote prior examination raises the  question of an underlying stricture. Additional considerations such as dystrophic calcification secondary to prior intervention or trauma, or metaplastic calcification within a neoplasm are unusual. 2. Extensive aortoiliac atherosclerotic calcification. Particularly prominent calcification is seen at the origin of the mesenteric vessels, though the degree of stenosis is not well assessed on this non arteriographic examination. Aortic Atherosclerosis (ICD10-I70.0). Electronically Signed   By: Helyn Numbers MD   On: 02/08/2020 02:38     Assessment and Plan: Right ureteral obstruction with probable stone and pyonephrosis with AKI.    She is improving s/p stenting with excellent UOP and a decline in the Cr.   D/C foley today.  She will need Ureteroscopy in a few weeks.       LOS: 1 day    Bjorn Pippin 02/09/2020 [phone] ID: [name], female   DOB: 1950-12-02, 69 y.o.   MRN: [id number]

## 2020-02-10 LAB — CBC
HCT: 36 % (ref 36.0–46.0)
Hemoglobin: 11.6 g/dL — ABNORMAL LOW (ref 12.0–15.0)
MCH: 29.9 pg (ref 26.0–34.0)
MCHC: 32.2 g/dL (ref 30.0–36.0)
MCV: 92.8 fL (ref 80.0–100.0)
Platelets: 335 10*3/uL (ref 150–400)
RBC: 3.88 MIL/uL (ref 3.87–5.11)
RDW: 14.8 % (ref 11.5–15.5)
WBC: 17.5 10*3/uL — ABNORMAL HIGH (ref 4.0–10.5)
nRBC: 0 % (ref 0.0–0.2)

## 2020-02-10 LAB — BASIC METABOLIC PANEL
Anion gap: 8 (ref 5–15)
BUN: 55 mg/dL — ABNORMAL HIGH (ref 8–23)
CO2: 15 mmol/L — ABNORMAL LOW (ref 22–32)
Calcium: 8.2 mg/dL — ABNORMAL LOW (ref 8.9–10.3)
Chloride: 115 mmol/L — ABNORMAL HIGH (ref 98–111)
Creatinine, Ser: 2.38 mg/dL — ABNORMAL HIGH (ref 0.44–1.00)
GFR calc Af Amer: 23 mL/min — ABNORMAL LOW (ref >60)
GFR calc non Af Amer: 20 mL/min — ABNORMAL LOW (ref >60)
Glucose, Bld: 91 mg/dL (ref 70–99)
Potassium: 4.6 mmol/L (ref 3.5–5.1)
Sodium: 138 mmol/L (ref 135–145)

## 2020-02-10 LAB — URINE CULTURE: Culture: NO GROWTH

## 2020-02-10 MED ORDER — SODIUM BICARBONATE 650 MG PO TABS
1300.0000 mg | ORAL_TABLET | Freq: Two times a day (BID) | ORAL | Status: DC
  Administered 2020-02-10 – 2020-02-12 (×5): 1300 mg via ORAL
  Filled 2020-02-10 (×5): qty 2

## 2020-02-10 MED ORDER — ALBUTEROL SULFATE (2.5 MG/3ML) 0.083% IN NEBU
2.5000 mg | INHALATION_SOLUTION | Freq: Four times a day (QID) | RESPIRATORY_TRACT | Status: DC | PRN
  Administered 2020-02-11 – 2020-02-12 (×2): 2.5 mg via RESPIRATORY_TRACT
  Filled 2020-02-10 (×2): qty 3

## 2020-02-10 NOTE — Progress Notes (Signed)
PROGRESS NOTE   Jenna [name]  BWL:[id number]    DOB: 1951/04/22    DOA: 02/07/2020  PCP: Ward, Clois Comber, FNP   I have briefly reviewed patients previous medical records in [facility].  Chief Complaint  Patient presents with  . Abnormal Lab    Brief Narrative:  69 year old female with PMH of anxiety, depression, COPD, HTN, OSA, tobacco abuse, CAD, referred to ED by PCP due to right flank pain and worsening renal failure.  She had been seen in the ED on 12/14/2019 for flank pain and gross hematuria, CT head showed moderate right-sided hydroureteronephrosis with ill-defined hyperdensity within the distal 2 cm of the right ureter and some mild left hydroureteronephrosis, recommended outpatient follow-up with urology which she was unable to do due to financial constraints.  Since then she has continued to have right flank pain but no dysuria or hematuria.  Admitted for acute on chronic kidney disease, persistent moderate right hydronephrosis and hydroureter with distal ureteral obstruction, hyperkalemia and metabolic acidosis.  Urology consulted and s/p cystoscopy with right retrograde pyelogram and insertion of right double-J stent 8/27.  Improving.   Assessment & Plan:  Principal Problem:   Hydronephrosis of right kidney Active Problems:   HTN (hypertension)   Mixed hyperlipidemia   Smoking   COPD (chronic obstructive pulmonary disease) (HCC)   Hyponatremia   Hyperkalemia   Acute renal failure superimposed on chronic kidney disease (HCC)   Acute renal failure complicating chronic kidney disease stage IIIb: Most recent creatinine on 7/2: 1.49.  Prior to that creatinine in 2019 was normal at 0.9.  Major component for her acute renal failure seems to be obstructive/right hydroureteronephrosis secondary to distal ureteral obstruction.  S/p right ureteral stent 8/27.  Continue IVF.  Creatinine slowly trending down/2.38 today.  Bicarbonate low and fluctuating/15.  Started oral bicarb  tablets.  Avoid nephrotoxic's.  Follow daily BMP and hopefully in the next 24 to 48 hours, creatinine will be back to baseline.  Right pyelonephrosis with probable right distal ureteral stone: Urology consulted and s/p cystoscopy with right retrograde pyelogram and insertion of right double-J stent 8/27. Urology follow-up appreciated.  Discussed with Dr. Annabell Howells.  Discontinuing Foley catheter and she will need ureteroscopy in few weeks.  Urine culture from 8/27 AM showed multiple species but the evening sample was no growth.  Continue IV ceftriaxone, day 3 today and then stop.    Hyperkalemia: Potassium 5.4 on admission.  Resolved after IV fluid hydration alone.  Hold ACEI/Aldactone.  Metabolic acidosis, NAG: Stable.  Started sodium bicarb tablets.  Follow BMP daily.  Hyponatremia:  Likely due to dehydration and acute renal failure.  Resolved today.  Essential hypertension: Reasonable control.  Hold ACEI, diuretics.  Dyslipidemia:  Continue atorvastatin.  Tobacco abuse: Cessation counseled.  Continue nicotine patch.  COPD: No clinical bronchospasm.  Body mass index is 36.33 kg/m./Obesity  Anemia: Could be dilutional.  Stable.   DVT prophylaxis: SCD Code Status: Full Family Communication: None at bedside Disposition:  Status is: Inpatient.  The patient will require care spanning > 2 midnights and should be moved to inpatient because: Persistent severe electrolyte disturbances, IV treatments appropriate due to intensity of illness or inability to take PO and Inpatient level of care appropriate due to severity of illness  Dispo: The patient is from: Home              Anticipated d/c is to: Home  Anticipated d/c date is: 2 days.              Patient currently is not medically stable to d/c.        Consultants:   Urology   Procedures:   s/p cystoscopy with right retrograde pyelogram and insertion of right double-J stent 8/27. Foley catheter discontinued  8/28.  Antimicrobials:   IV ceftriaxone   Subjective:  Denies complaints.  Anxious to go home.  Urinating well.  No pain reported.  Objective:   Vitals:   02/09/20 2303 02/10/20 0441 02/10/20 1046 02/10/20 1305  BP: (!) 112/46 (!) 112/44 (!) 114/58 138/68  Pulse: 67 66 68 (!) 59  Resp:  18  19  Temp:  97.6 F (36.4 C)  97.9 F (36.6 C)  TempSrc:  Oral  Oral  SpO2:  98%  100%  Weight:      Height:        General exam: Pleasant middle-aged female, moderately built and nourished seen ambulating comfortably in the room. Respiratory system: Clear to auscultation. Respiratory effort normal. Cardiovascular system: S1 & S2 heard, RRR. No JVD, murmurs, rubs, gallops or clicks. No pedal edema.  Telemetry personally reviewed: Sinus rhythm.  Occasional sinus bradycardia in the 50s. Gastrointestinal system: Abdomen is nondistended, soft and nontender. No organomegaly or masses felt. Normal bowel sounds heard. Central nervous system: Alert and oriented. No focal neurological deficits. Extremities: Symmetric 5 x 5 power. Skin: No rashes, lesions or ulcers Psychiatry: Judgement and insight appear normal. Mood & affect appropriate.     Data Reviewed:   I have personally reviewed following labs and imaging studies   CBC: Recent Labs  Lab 02/07/20 1357 02/07/20 1357 02/08/20 0659 02/09/20 0647 02/10/20 0600  WBC 13.1*   < > 14.3* 14.9* 17.5*  NEUTROABS 9.9*  --   --   --   --   HGB 13.9   < > 13.8 11.6* 11.6*  HCT 43.7   < > 42.2 35.6* 36.0  MCV 92.6   < > 89.4 89.7 92.8  PLT 387   < > 370 351 335   < > = values in this interval not displayed.    Basic Metabolic Panel: Recent Labs  Lab 02/09/20 0647 02/09/20 1645 02/10/20 0600  NA 132* 135 138  K 5.1 4.8 4.6  CL 108 110 115*  CO2 14* 16* 15*  GLUCOSE 133* 105* 91  BUN 58* 56* 55*  CREATININE 3.12* 3.08* 2.38*  CALCIUM 8.5* 8.4* 8.2*    Liver Function Tests: Recent Labs  Lab 02/07/20 1357 02/08/20 0659  AST  40 29  ALT 54* 46*  ALKPHOS 116 119  BILITOT 0.7 0.3  PROT 8.7* 8.8*  ALBUMIN 3.2* 3.5    CBG: No results for input(s): GLUCAP in the last 168 hours.  Microbiology Studies:   Recent Results (from the past 240 hour(s))  SARS Coronavirus 2 by RT PCR (hospital order, performed in [facility] hospital lab) Nasopharyngeal Nasopharyngeal Swab     Status: None   Collection Time: 02/08/20  1:14 AM   Specimen: Nasopharyngeal Swab  Result Value Ref Range Status   SARS Coronavirus 2 NEGATIVE NEGATIVE Final    Comment: (NOTE) SARS-CoV-2 target nucleic acids are NOT DETECTED.  The SARS-CoV-2 RNA is generally detectable in upper and lower respiratory specimens during the acute phase of infection. The lowest concentration of SARS-CoV-2 viral copies this assay can detect is 250 copies / mL. A negative result does not preclude SARS-CoV-2 infection  and should not be used as the sole basis for treatment or other patient management decisions.  A negative result may occur with improper specimen collection / handling, submission of specimen other than nasopharyngeal swab, presence of viral mutation(s) within the areas targeted by this assay, and inadequate number of viral copies (<250 copies / mL). A negative result must be combined with clinical observations, patient history, and epidemiological information.  Fact Sheet for Patients:   BoilerBrush.com.cy  Fact Sheet for Healthcare Providers: https://pope.com/  This test is not yet approved or  cleared by the [place] FDA and has been authorized for detection and/or diagnosis of SARS-CoV-2 by FDA under an Emergency Use Authorization (EUA).  This EUA will remain in effect (meaning this test can be used) for the duration of the COVID-19 declaration under Section 564(b)(1) of the Act, 21 U.S.C. section 360bbb-3(b)(1), unless the authorization is terminated or revoked sooner.  Performed at  [facility], [address], [place], [place] [address]   Urine culture     Status: Abnormal   Collection Time: 02/08/20  3:57 AM   Specimen: Urine, Random  Result Value Ref Range Status   Specimen Description URINE, RANDOM  Final   Special Requests   Final    NONE Performed at [facility], [address], [place], [place] [address]    Culture MULTIPLE SPECIES PRESENT, SUGGEST RECOLLECTION (A)  Final   Report Status 02/08/2020 FINAL  Final  Urine Culture     Status: None   Collection Time: 02/08/20  5:01 PM   Specimen: Urine, Catheterized  Result Value Ref Range Status   Specimen Description   Final    URINE, CATHETERIZED Performed at [facility], [address], [place], [place] [address]    Special Requests   Final    NONE Performed at [facility], [address] [place], [place] [address]    Culture   Final    NO GROWTH Performed at [facility], [address], [place], [place] [address]    Report Status 02/10/2020 FINAL  Final     Radiology Studies:  DG C-Arm 1-60 Min-No Report  Result Date: 02/08/2020 Fluoroscopy was utilized by the requesting physician.  No radiographic interpretation.     Scheduled Meds:   . atorvastatin  80 mg Oral q1800  . Chlorhexidine Gluconate Cloth  6 each Topical Daily  . ezetimibe  10 mg Oral Daily  . loratadine  10 mg Oral Daily  . metoprolol tartrate  50 mg Oral BID  . nicotine  21 mg Transdermal Daily  . oxybutynin  5 mg Oral Daily  . venlafaxine XR  37.5 mg Oral Daily    Continuous Infusions:   . sodium chloride 100 mL/hr at 02/10/20 0700  . cefTRIAXone (ROCEPHIN)  IV Stopped (02/09/20 2342)     LOS: 2 days     Marcellus Scott, MD, [place], [facility]    To contact the attending provider between 7A-7P or the covering provider during after hours 7P-7A, please log into the web site [email or web address] and access using universal [place] password for  that web site. If you do not have the password, please call the hospital operator.  02/10/2020, 1:53 PM

## 2020-02-10 NOTE — H&P (View-Only) (Signed)
2 Days Post-Op  Subjective: Jenna [name] is doing well today without pain. She is voiding well without the foley.  She has no fever.   Cystoscopy and Retrograde demonstrated right pyonephrosis with a probable right distal stone which based on her prior culture in July which grew proteus is likely  Struvite which tends to be more radiolucent.  Her Cr continues to fall but only slowly.       ROS:  Review of Systems  Constitutional: Negative for chills and fever.  Genitourinary: Negative for flank pain.    Anti-infectives: Anti-infectives (From admission, onward)   Start     Dose/Rate Route Frequency Ordered Stop   02/08/20 2000  cefTRIAXone (ROCEPHIN) 1 g in sodium chloride 0.9 % 100 mL IVPB        1 g 200 mL/hr over 30 Minutes Intravenous Every 24 hours 02/08/20 1817     02/08/20 1315  ceFAZolin (ANCEF) powder 2 g  Status:  Discontinued        2 g Other To Surgery 02/08/20 1217 02/08/20 1219   02/08/20 1220  ceFAZolin (ANCEF) IVPB 2g/100 mL premix        2 g 200 mL/hr over 30 Minutes Intravenous 60 min pre-op 02/08/20 1220 02/08/20 1643      Current Facility-Administered Medications  Medication Dose Route Frequency Provider Last Rate Last Admin  . 0.9 %  sodium chloride infusion   Intravenous Continuous Ortiz, David Manuel, MD 100 mL/hr at 02/10/20 0455 Rate Verify at 02/10/20 0455  . acetaminophen (TYLENOL) tablet 650 mg  650 mg Oral Q6H PRN Ortiz, David Manuel, MD       Or  . acetaminophen (TYLENOL) suppository 650 mg  650 mg Rectal Q6H PRN Ortiz, David Manuel, MD      . atorvastatin (LIPITOR) tablet 80 mg  80 mg Oral q1800 Rainier Feuerborn, MD   80 mg at 02/09/20 1716  . cefTRIAXone (ROCEPHIN) 1 g in sodium chloride 0.9 % 100 mL IVPB  1 g Intravenous Q24H Lilli Dewald, MD   Stopped at 02/09/20 2342  . Chlorhexidine Gluconate Cloth 2 % PADS 6 each  6 each Topical Daily Hongalgi, Anand D, MD   6 each at 02/08/20 2028  . ezetimibe (ZETIA) tablet 10 mg  10 mg Oral Daily Ramla Hase, MD   10 mg at  02/09/20 0921  . loratadine (CLARITIN) tablet 10 mg  10 mg Oral Daily Emmamae Mcnamara, MD   10 mg at 02/09/20 0922  . metoprolol tartrate (LOPRESSOR) tablet 50 mg  50 mg Oral BID Hongalgi, Anand D, MD   50 mg at 02/09/20 0922  . nicotine (NICODERM CQ - dosed in mg/24 hours) patch 21 mg  21 mg Transdermal Daily Ortiz, David Manuel, MD   21 mg at 02/09/20 0922  . nitroGLYCERIN (NITROSTAT) SL tablet 0.4 mg  0.4 mg Sublingual Q5 min PRN English Craighead, MD      . ondansetron (ZOFRAN) tablet 4 mg  4 mg Oral Q6H PRN Ortiz, David Manuel, MD       Or  . ondansetron (ZOFRAN) injection 4 mg  4 mg Intravenous Q6H PRN Ortiz, David Manuel, MD   4 mg at 02/08/20 1658  . oxybutynin (DITROPAN) tablet 5 mg  5 mg Oral Daily Hongalgi, Anand D, MD   5 mg at 02/09/20 0921  . oxyCODONE (Oxy IR/ROXICODONE) immediate release tablet 5 mg  5 mg Oral Q4H PRN Ortiz, David Manuel, MD      . venlafaxine XR (EFFEXOR-XR) 24   hr capsule 37.5 mg  37.5 mg Oral Daily Marcellus Scott D, MD   37.5 mg at 02/09/20 1025     Objective: Vital signs in last 24 hours: Temp:  [97.5 F (36.4 C)-98.1 F (36.7 C)] 97.6 F (36.4 C) (08/29 0441) Pulse Rate:  [53-81] 66 (08/29 0441) Resp:  [17-20] 18 (08/29 0441) BP: (109-135)/(37-66) 112/44 (08/29 0441) SpO2:  [97 %-100 %] 98 % (08/29 0441)  Intake/Output from previous day: 08/28 0701 - 08/29 0700 In: 2579.9 [P.O.:240; I.V.:2139.9; IV Piggyback:200] Out: 1340 [Urine:1340] Intake/Output this shift: Total I/O In: 1417.8 [I.V.:1317.8; IV Piggyback:100] Out: 1000 [Urine:1000]   Physical Exam Vitals reviewed.  Constitutional:      Appearance: Normal appearance.  Neurological:     Mental Status: She is alert.     Lab Results:  Recent Labs    02/08/20 0659 02/09/20 0647  WBC 14.3* 14.9*  HGB 13.8 11.6*  HCT 42.2 35.6*  PLT 370 351   BMET Recent Labs    02/09/20 0647 02/09/20 1645  NA 132* 135  K 5.1 4.8  CL 108 110  CO2 14* 16*  GLUCOSE 133* 105*  BUN 58* 56*   CREATININE 3.12* 3.08*  CALCIUM 8.5* 8.4*   PT/INR No results for input(s): LABPROT, INR in the last 72 hours. ABG No results for input(s): PHART, HCO3 in the last 72 hours.  Invalid input(s): PCO2, PO2  Studies/Results: DG C-Arm 1-60 Min-No Report  Result Date: 02/08/2020 Fluoroscopy was utilized by the requesting physician.  No radiographic interpretation.   Labs reviewed.   Voided culture is growing Mx species.   Culture from OR is pending.   Assessment and Plan: Right ureteral obstruction with probable stone and pyonephrosis with AKI.    She is improving s/p stenting with excellent UOP and a slow decline in the Cr.    She will need Ureteroscopy in a few weeks.       LOS: 2 days    Bjorn Pippin 02/10/2020 [phone] ID: Jenna [name], female   DOB: Jul 21, 1950, 69 y.o.   MRN: [id number] Patient ID: Jenna [name], female   DOB: 1950-10-14, 69 y.o.   MRN: [id number]

## 2020-02-10 NOTE — Progress Notes (Signed)
2 Days Post-Op  Subjective: Jenna [name] is doing well today without pain. She is voiding well without the foley.  She has no fever.   Cystoscopy and Retrograde demonstrated right pyonephrosis with a probable right distal stone which based on her prior culture in July which grew proteus is likely  Struvite which tends to be more radiolucent.  Her Cr continues to fall but only slowly.       ROS:  Review of Systems  Constitutional: Negative for chills and fever.  Genitourinary: Negative for flank pain.    Anti-infectives: Anti-infectives (From admission, onward)   Start     Dose/Rate Route Frequency Ordered Stop   02/08/20 2000  cefTRIAXone (ROCEPHIN) 1 g in sodium chloride 0.9 % 100 mL IVPB        1 g 200 mL/hr over 30 Minutes Intravenous Every 24 hours 02/08/20 1817     02/08/20 1315  ceFAZolin (ANCEF) powder 2 g  Status:  Discontinued        2 g Other To Surgery 02/08/20 1217 02/08/20 1219   02/08/20 1220  ceFAZolin (ANCEF) IVPB 2g/100 mL premix        2 g 200 mL/hr over 30 Minutes Intravenous 60 min pre-op 02/08/20 1220 02/08/20 1643      Current Facility-Administered Medications  Medication Dose Route Frequency Provider Last Rate Last Admin  . 0.9 %  sodium chloride infusion   Intravenous Continuous Bobette Mo, MD 100 mL/hr at 02/10/20 0455 Rate Verify at 02/10/20 0455  . acetaminophen (TYLENOL) tablet 650 mg  650 mg Oral Q6H PRN Bobette Mo, MD       Or  . acetaminophen (TYLENOL) suppository 650 mg  650 mg Rectal Q6H PRN Bobette Mo, MD      . atorvastatin (LIPITOR) tablet 80 mg  80 mg Oral q1800 Bjorn Pippin, MD   80 mg at 02/09/20 1716  . cefTRIAXone (ROCEPHIN) 1 g in sodium chloride 0.9 % 100 mL IVPB  1 g Intravenous Q24H Bjorn Pippin, MD   Stopped at 02/09/20 2342  . Chlorhexidine Gluconate Cloth 2 % PADS 6 each  6 each Topical Daily Elease Etienne, MD   6 each at 02/08/20 2028  . ezetimibe (ZETIA) tablet 10 mg  10 mg Oral Daily Bjorn Pippin, MD   10 mg at  02/09/20 2409  . loratadine (CLARITIN) tablet 10 mg  10 mg Oral Daily Bjorn Pippin, MD   10 mg at 02/09/20 7353  . metoprolol tartrate (LOPRESSOR) tablet 50 mg  50 mg Oral BID Marcellus Scott D, MD   50 mg at 02/09/20 2992  . nicotine (NICODERM CQ - dosed in mg/24 hours) patch 21 mg  21 mg Transdermal Daily Bobette Mo, MD   21 mg at 02/09/20 4268  . nitroGLYCERIN (NITROSTAT) SL tablet 0.4 mg  0.4 mg Sublingual Q5 min PRN Bjorn Pippin, MD      . ondansetron [facility]) tablet 4 mg  4 mg Oral Q6H PRN Bobette Mo, MD       Or  . ondansetron [facility]) injection 4 mg  4 mg Intravenous Q6H PRN Bobette Mo, MD   4 mg at 02/08/20 1658  . oxybutynin (DITROPAN) tablet 5 mg  5 mg Oral Daily Elease Etienne, MD   5 mg at 02/09/20 3419  . oxyCODONE (Oxy IR/ROXICODONE) immediate release tablet 5 mg  5 mg Oral Q4H PRN Bobette Mo, MD      . venlafaxine XR [facility]) 24  hr capsule 37.5 mg  37.5 mg Oral Daily Marcellus Scott D, MD   37.5 mg at 02/09/20 1025     Objective: Vital signs in last 24 hours: Temp:  [97.5 F (36.4 C)-98.1 F (36.7 C)] 97.6 F (36.4 C) (08/29 0441) Pulse Rate:  [53-81] 66 (08/29 0441) Resp:  [17-20] 18 (08/29 0441) BP: (109-135)/(37-66) 112/44 (08/29 0441) SpO2:  [97 %-100 %] 98 % (08/29 0441)  Intake/Output from previous day: 08/28 0701 - 08/29 0700 In: 2579.9 [P.O.:240; I.V.:2139.9; IV Piggyback:200] Out: 1340 [Urine:1340] Intake/Output this shift: Total I/O In: 1417.8 [I.V.:1317.8; IV Piggyback:100] Out: 1000 [Urine:1000]   Physical Exam Vitals reviewed.  Constitutional:      Appearance: Normal appearance.  Neurological:     Mental Status: She is alert.     Lab Results:  Recent Labs    02/08/20 0659 02/09/20 0647  WBC 14.3* 14.9*  HGB 13.8 11.6*  HCT 42.2 35.6*  PLT 370 351   BMET Recent Labs    02/09/20 0647 02/09/20 1645  NA 132* 135  K 5.1 4.8  CL 108 110  CO2 14* 16*  GLUCOSE 133* 105*  BUN 58* 56*   CREATININE 3.12* 3.08*  CALCIUM 8.5* 8.4*   PT/INR No results for input(s): LABPROT, INR in the last 72 hours. ABG No results for input(s): PHART, HCO3 in the last 72 hours.  Invalid input(s): PCO2, PO2  Studies/Results: DG C-Arm 1-60 Min-No Report  Result Date: 02/08/2020 Fluoroscopy was utilized by the requesting physician.  No radiographic interpretation.   Labs reviewed.   Voided culture is growing Mx species.   Culture from OR is pending.   Assessment and Plan: Right ureteral obstruction with probable stone and pyonephrosis with AKI.    She is improving s/p stenting with excellent UOP and a slow decline in the Cr.    She will need Ureteroscopy in a few weeks.       LOS: 2 days    Bjorn Pippin 02/10/2020 [phone] ID: Jenna [name], female   DOB: Jul 21, 1950, 69 y.o.   MRN: [id number] Patient ID: Jenna [name], female   DOB: 1950-10-14, 69 y.o.   MRN: [id number]

## 2020-02-11 LAB — CBC
HCT: 36.3 % (ref 36.0–46.0)
Hemoglobin: 11.7 g/dL — ABNORMAL LOW (ref 12.0–15.0)
MCH: 29.8 pg (ref 26.0–34.0)
MCHC: 32.2 g/dL (ref 30.0–36.0)
MCV: 92.4 fL (ref 80.0–100.0)
Platelets: 338 10*3/uL (ref 150–400)
RBC: 3.93 MIL/uL (ref 3.87–5.11)
RDW: 14.8 % (ref 11.5–15.5)
WBC: 11.1 10*3/uL — ABNORMAL HIGH (ref 4.0–10.5)
nRBC: 0 % (ref 0.0–0.2)

## 2020-02-11 LAB — BASIC METABOLIC PANEL
Anion gap: 9 (ref 5–15)
BUN: 43 mg/dL — ABNORMAL HIGH (ref 8–23)
CO2: 16 mmol/L — ABNORMAL LOW (ref 22–32)
Calcium: 8.3 mg/dL — ABNORMAL LOW (ref 8.9–10.3)
Chloride: 114 mmol/L — ABNORMAL HIGH (ref 98–111)
Creatinine, Ser: 2.14 mg/dL — ABNORMAL HIGH (ref 0.44–1.00)
GFR calc Af Amer: 27 mL/min — ABNORMAL LOW (ref >60)
GFR calc non Af Amer: 23 mL/min — ABNORMAL LOW (ref >60)
Glucose, Bld: 88 mg/dL (ref 70–99)
Potassium: 4.2 mmol/L (ref 3.5–5.1)
Sodium: 139 mmol/L (ref 135–145)

## 2020-02-11 NOTE — Progress Notes (Signed)
PROGRESS NOTE   Jenna [name]  FAO:[id number]    DOB: Dec 28, 1950    DOA: 02/07/2020  PCP: Ward, Clois Comber, FNP   I have briefly reviewed patients previous medical records in [facility].  Chief Complaint  Patient presents with  . Abnormal Lab    Brief Narrative:  69 year old female with PMH of anxiety, depression, COPD, HTN, OSA, tobacco abuse, CAD, referred to ED by PCP due to right flank pain and worsening renal failure.  She had been seen in the ED on 12/14/2019 for flank pain and gross hematuria, CT head showed moderate right-sided hydroureteronephrosis with ill-defined hyperdensity within the distal 2 cm of the right ureter and some mild left hydroureteronephrosis, recommended outpatient follow-up with urology which she was unable to do due to financial constraints.  Since then she has continued to have right flank pain but no dysuria or hematuria.  Admitted for acute on chronic kidney disease, persistent moderate right hydronephrosis and hydroureter with distal ureteral obstruction, hyperkalemia and metabolic acidosis.  Urology consulted and s/p cystoscopy with right retrograde pyelogram and insertion of right double-J stent 8/27.  Improving.  Possible discharge home 8/31.   Assessment & Plan:  Principal Problem:   Hydronephrosis of right kidney Active Problems:   HTN (hypertension)   Mixed hyperlipidemia   Smoking   COPD (chronic obstructive pulmonary disease) (HCC)   Hyponatremia   Hyperkalemia   Acute renal failure superimposed on chronic kidney disease (HCC)   Acute renal failure complicating chronic kidney disease stage IIIb: Most recent creatinine on 7/2: 1.49.  Prior to that creatinine in 2019 was normal at 0.9.  Major component for her acute renal failure seems to be obstructive/right hydroureteronephrosis secondary to distal ureteral obstruction.  S/p right ureteral stent 8/27.  Continue IVF.  Creatinine slowly trending down/2.14 today.  Bicarbonate low and  fluctuating/16.  Started oral bicarb tablets.  Avoid nephrotoxic's.  Follow a.m. BMP and hopefully should be able to DC 8/31.  It is possible that she may have achieved a new higher creatinine baseline.  Recommend outpatient nephrology consultation.  She has a high A/G ratio which will need to be evaluated as outpatient.  Right pyelonephrosis with probable right distal ureteral stone: Urology consulted and s/p cystoscopy with right retrograde pyelogram and insertion of right double-J stent 8/27. Urology follow-up appreciated.  Discussed with Dr. Annabell Howells.  Discontinuing Foley catheter and she will need ureteroscopy in few weeks.  Urine culture from 8/27 AM showed multiple species but the evening sample was no growth.  Completed 3 days of IV ceftriaxone.  Hyperkalemia: Potassium 5.4 on admission.  Resolved after IV fluid hydration alone.  Hold ACEI/Aldactone.  Metabolic acidosis, NAG: Stable.  Started sodium bicarb tablets.  Continue to follow daily BMP.  If not increasing, may consider increasing sodium bicarbonate dose in a.m.  Hyponatremia:  Likely due to dehydration and acute renal failure.  Resolved.  Essential hypertension: Reasonable control.  Hold ACEI, diuretics and would avoid at discharge.  Dyslipidemia:  Continue atorvastatin.  Tobacco abuse: Cessation counseled.  Continue nicotine patch.  COPD: No clinical bronchospasm.  Body mass index is 36.33 kg/m./Obesity  Anemia: Could be dilutional.  Stable.   DVT prophylaxis: SCD Code Status: Full Family Communication: None at bedside Disposition:  Status is: Inpatient.  The patient will require care spanning > 2 midnights and should be moved to inpatient because: Persistent severe electrolyte disturbances, IV treatments appropriate due to intensity of illness or inability to take PO and Inpatient level  of care appropriate due to severity of illness  Dispo: The patient is from: Home              Anticipated d/c is to: Home               Anticipated d/c date is: Likely 8/31              Patient currently is not medically stable to d/c.  Pending further improvement in renal insufficiency and ongoing IV fluids.        Consultants:   Urology   Procedures:   s/p cystoscopy with right retrograde pyelogram and insertion of right double-J stent 8/27. Foley catheter discontinued 8/28.  Antimicrobials:   IV ceftriaxone-completed 3 days course.   Subjective:  No complaints reported.  No dyspnea.  Reports that she has a PCP follow-up appointment on Wednesday 9/1.  Objective:   Vitals:   02/10/20 2042 02/11/20 0516 02/11/20 0912 02/11/20 1221  BP: (!) 130/55 (!) 122/31  (!) 121/50  Pulse: 63 (!) 58  60  Resp: 18 18  (!) 22  Temp: 97.9 F (36.6 C) 98.1 F (36.7 C)  98.6 F (37 C)  TempSrc: Oral Oral  Oral  SpO2: 97% 98% 96% 100%  Weight:      Height:        General exam: Pleasant middle-aged female, moderately built and nourished seen sitting up comfortably at edge of bed. Respiratory system: Clear to auscultation.  No increased work of breathing. Cardiovascular system: S1 & S2 heard, RRR. No JVD, murmurs, rubs, gallops or clicks. No pedal edema.   Gastrointestinal system: Abdomen is nondistended, soft and nontender. No organomegaly or masses felt. Normal bowel sounds heard. Central nervous system: Alert and oriented. No focal neurological deficits. Extremities: Symmetric 5 x 5 power. Skin: No rashes, lesions or ulcers Psychiatry: Judgement and insight appear normal. Mood & affect appropriate.     Data Reviewed:   I have personally reviewed following labs and imaging studies   CBC: Recent Labs  Lab 02/07/20 1357 02/08/20 0659 02/09/20 0647 02/10/20 0600 02/11/20 0636  WBC 13.1*   < > 14.9* 17.5* 11.1*  NEUTROABS 9.9*  --   --   --   --   HGB 13.9   < > 11.6* 11.6* 11.7*  HCT 43.7   < > 35.6* 36.0 36.3  MCV 92.6   < > 89.7 92.8 92.4  PLT 387   < > 351 335 338   < > = values in this  interval not displayed.    Basic Metabolic Panel: Recent Labs  Lab 02/09/20 1645 02/10/20 0600 02/11/20 0636  NA 135 138 139  K 4.8 4.6 4.2  CL 110 115* 114*  CO2 16* 15* 16*  GLUCOSE 105* 91 88  BUN 56* 55* 43*  CREATININE 3.08* 2.38* 2.14*  CALCIUM 8.4* 8.2* 8.3*    Liver Function Tests: Recent Labs  Lab 02/07/20 1357 02/08/20 0659  AST 40 29  ALT 54* 46*  ALKPHOS 116 119  BILITOT 0.7 0.3  PROT 8.7* 8.8*  ALBUMIN 3.2* 3.5    CBG: No results for input(s): GLUCAP in the last 168 hours.  Microbiology Studies:   Recent Results (from the past 240 hour(s))  SARS Coronavirus 2 by RT PCR (hospital order, [facility]) Nasopharyngeal Nasopharyngeal Swab     Status: None   Collection Time: 02/08/20  1:14 AM   Specimen: Nasopharyngeal Swab  Result Value Ref Range Status  SARS Coronavirus 2 NEGATIVE NEGATIVE Final    Comment: (NOTE) SARS-CoV-2 target nucleic acids are NOT DETECTED.  The SARS-CoV-2 RNA is generally detectable in upper and lower respiratory specimens during the acute phase of infection. The lowest concentration of SARS-CoV-2 viral copies this assay can detect is 250 copies / mL. A negative result does not preclude SARS-CoV-2 infection and should not be used as the sole basis for treatment or other patient management decisions.  A negative result may occur with improper specimen collection / handling, submission of specimen other than nasopharyngeal swab, presence of viral mutation(s) within the areas targeted by this assay, and inadequate number of viral copies (<250 copies / mL). A negative result must be combined with clinical observations, patient history, and epidemiological information.  Fact Sheet for Patients:   BoilerBrush.com.cy  Fact Sheet for Healthcare Providers: https://pope.com/  This test is not yet approved or  cleared by the [place] FDA and has been  authorized for detection and/or diagnosis of SARS-CoV-2 by FDA under an Emergency Use Authorization (EUA).  This EUA will remain in effect (meaning this test can be used) for the duration of the COVID-19 declaration under Section 564(b)(1) of the Act, 21 U.S.C. section 360bbb-3(b)(1), unless the authorization is terminated or revoked sooner.  Performed at [facility], [address] [place], [place] [address]   Urine culture     Status: Abnormal   Collection Time: 02/08/20  3:57 AM   Specimen: Urine, Random  Result Value Ref Range Status   Specimen Description URINE, RANDOM  Final   Special Requests   Final    NONE Performed at [facility], [address] [place], [place] [address]    Culture MULTIPLE SPECIES PRESENT, SUGGEST RECOLLECTION (A)  Final   Report Status 02/08/2020 FINAL  Final  Urine Culture     Status: None   Collection Time: 02/08/20  5:01 PM   Specimen: Urine, Catheterized  Result Value Ref Range Status   Specimen Description   Final    URINE, CATHETERIZED Performed at [facility], [address] [place], [place] [address]    Special Requests   Final    NONE Performed at [facility], [address], [place], [place] [address]    Culture   Final    NO GROWTH Performed at [facility], [address], [place], [place] [address]    Report Status 02/10/2020 FINAL  Final     Radiology Studies:  No results found.   Scheduled Meds:   . atorvastatin  80 mg Oral q1800  . Chlorhexidine Gluconate Cloth  6 each Topical Daily  . ezetimibe  10 mg Oral Daily  . loratadine  10 mg Oral Daily  . metoprolol tartrate  50 mg Oral BID  . nicotine  21 mg Transdermal Daily  . oxybutynin  5 mg Oral Daily  . sodium bicarbonate  1,300 mg Oral BID  . venlafaxine XR  37.5 mg Oral Daily    Continuous Infusions:   . sodium chloride 100 mL/hr at 02/11/20 0817     LOS: 3 days     Marcellus Scott, MD, [place],  [facility]. [facility]    To contact the attending provider between 7A-7P or the covering provider during after hours 7P-7A, please log into the web site [email or web address] and access using universal [place] password for that web site. If you do not have the password, please call the hospital operator.  02/11/2020, 4:24 PM

## 2020-02-11 NOTE — Care Management Important Message (Signed)
Important Message  Patient Details IM Letter given to the Patient Name: Jenna [name] MRN: [id number] Date of Birth: 1950-11-12   Medicare Important Message Given:  Yes     Caren Macadam 02/11/2020, 9:33 AM

## 2020-02-12 DIAGNOSIS — E872 Acidosis, unspecified: Secondary | ICD-10-CM

## 2020-02-12 LAB — BASIC METABOLIC PANEL
Anion gap: 11 (ref 5–15)
BUN: 34 mg/dL — ABNORMAL HIGH (ref 8–23)
CO2: 17 mmol/L — ABNORMAL LOW (ref 22–32)
Calcium: 8.1 mg/dL — ABNORMAL LOW (ref 8.9–10.3)
Chloride: 111 mmol/L (ref 98–111)
Creatinine, Ser: 1.92 mg/dL — ABNORMAL HIGH (ref 0.44–1.00)
GFR calc Af Amer: 30 mL/min — ABNORMAL LOW (ref >60)
GFR calc non Af Amer: 26 mL/min — ABNORMAL LOW (ref >60)
Glucose, Bld: 103 mg/dL — ABNORMAL HIGH (ref 70–99)
Potassium: 3.9 mmol/L (ref 3.5–5.1)
Sodium: 139 mmol/L (ref 135–145)

## 2020-02-12 MED ORDER — SODIUM BICARBONATE 650 MG PO TABS: 1300.0000 mg | ORAL_TABLET | Freq: Two times a day (BID) | ORAL | 0 refills | Status: AC

## 2020-02-12 NOTE — Progress Notes (Signed)
Pt explained and provided discharge instructions by RN. Pt had no questions or concerns. Pt dressed herself in own personal clothing. Pt belongings placed in bags. IV removed. Pt transported via wheelchair to main entrance.

## 2020-02-13 ENCOUNTER — Telehealth (HOSPITAL_BASED_OUTPATIENT_CLINIC_OR_DEPARTMENT_OTHER): Payer: Self-pay | Admitting: Emergency Medicine

## 2020-02-13 NOTE — Hospital Course (Signed)
69 year old female with PMH of anxiety, depression, COPD, HTN, OSA, tobacco abuse, CAD, referred to ED by PCP due to right flank pain and worsening renal failure.  She had been seen in the ED on 12/14/2019 for flank pain and gross hematuria, CT head showed moderate right-sided hydroureteronephrosis with ill-defined hyperdensity within the distal 2 cm of the right ureter and some mild left hydroureteronephrosis, recommended outpatient follow-up with urology which she was unable to do due to financial constraints.  Since then she has continued to have right flank pain but no dysuria or hematuria.   Admitted for acute on chronic kidney disease, persistent moderate right hydronephrosis and hydroureter with distal ureteral obstruction, hyperkalemia and metabolic acidosis.  Urology consulted and s/p cystoscopy with right retrograde pyelogram and insertion of right double-J stent 8/27.  She completed 3 days of empiric Rocephin during hospitalization.  Urine culture yielded no significant growth.

## 2020-02-13 NOTE — Discharge Summary (Signed)
Physician Discharge Summary  Jenna [name] RUE:[id number] DOB: [birth year] DOA: 02/07/2020  PCP: Joaquin Bend, FNP  Admit date: 02/07/2020 Discharge date: 02/13/2020  Admitted From: home Disposition:  home Discharging physician: Lewie Chamber, MD  Recommendations for Outpatient Follow-up:  1. Follow up with urology    Patient discharged to home in Discharge Condition: stable CODE STATUS: Full Diet recommendation:   Hospital Course: 69 year old female with PMH of anxiety, depression, COPD, HTN, OSA, tobacco abuse, CAD, referred to ED by PCP due to right flank pain and worsening renal failure.  She had been seen in the ED on 12/14/2019 for flank pain and gross hematuria, CT head showed moderate right-sided hydroureteronephrosis with ill-defined hyperdensity within the distal 2 cm of the right ureter and some mild left hydroureteronephrosis, recommended outpatient follow-up with urology which she was unable to do due to financial constraints.  Since then she has continued to have right flank pain but no dysuria or hematuria.   Admitted for acute on chronic kidney disease, persistent moderate right hydronephrosis and hydroureter with distal ureteral obstruction, hyperkalemia and metabolic acidosis.  Urology consulted and s/p cystoscopy with right retrograde pyelogram and insertion of right double-J stent 8/27.  She completed 3 days of empiric Rocephin during hospitalization.  Urine culture yielded no significant growth.   No problem-specific Assessment & Plan notes found for this encounter.    The patient's chronic medical conditions were treated accordingly per the patient's home medication regimen except as noted.  On day of discharge, patient was felt deemed stable for discharge. Patient/family member advised to call PCP or come back to ER if needed.   Discharge Diagnoses:   Principal Diagnosis: Hydronephrosis of right kidney  Active Hospital Problems   Diagnosis Date Noted  .  Metabolic acidosis, NAG, bicarbonate losses 02/12/2020  . COPD (chronic obstructive pulmonary disease) (HCC)   . Mixed hyperlipidemia   . Smoking   . HTN (hypertension) 09/26/2017    Resolved Hospital Problems   Diagnosis Date Noted Date Resolved  . Hydronephrosis of right kidney 02/08/2020 02/12/2020  . Acute renal failure superimposed on chronic kidney disease (HCC) 02/08/2020 02/12/2020  . Hyponatremia  02/12/2020  . Hyperkalemia  02/12/2020    Discharge Instructions    Increase activity slowly   Complete by: As directed      Allergies as of 02/12/2020      Reactions   Mustard [employer] Isothiocyanate] Anaphylaxis   Hydrochlorothiazide Hives, Swelling, Other (See Comments)   "swells, turns red, feels like it's burning"   Latex Hives, Itching, Dermatitis      Medication List    STOP taking these medications   aspirin 81 MG chewable tablet     TAKE these medications   albuterol (2.5 MG/3ML) 0.083% nebulizer solution Commonly known as: PROVENTIL Take 2.5 mg by nebulization every 6 (six) hours as needed for wheezing or shortness of breath.   albuterol 108 (90 Base) MCG/ACT inhaler Commonly known as: VENTOLIN HFA Inhale 2 puffs into the lungs every 4 (four) hours as needed for wheezing or shortness of breath.   atorvastatin 80 MG tablet Commonly known as: LIPITOR Take 1 tablet (80 mg total) by mouth daily at 6 PM.   cetirizine 10 MG tablet Commonly known as: ZYRTEC Take 10 mg by mouth every morning.   ezetimibe 10 MG tablet Commonly known as: ZETIA Take 10 mg by mouth daily.   fluticasone 50 MCG/ACT nasal spray Commonly known as: FLONASE Place 1 spray into both nostrils daily as  needed for allergies or rhinitis.   lisinopril 5 MG tablet Commonly known as: ZESTRIL Take 1 tablet (5 mg total) by mouth daily.   metoprolol tartrate 50 MG tablet Commonly known as: LOPRESSOR Take 50 mg by mouth 2 (two) times daily.   naproxen sodium 220 MG tablet Commonly known  as: ALEVE Take 220 mg by mouth 2 (two) times daily as needed (Pain).   nitroGLYCERIN 0.3 MG SL tablet Commonly known as: NITROSTAT Place 0.3 mg under the tongue every 5 (five) minutes as needed for chest pain.   nystatin powder Commonly known as: MYCOSTATIN/NYSTOP Apply 1 application topically 2 (two) times daily as needed (Rash).   ondansetron 4 MG disintegrating tablet Commonly known as: Zofran ODT Take 1 tablet (4 mg total) by mouth every 6 (six) hours as needed. What changed: reasons to take this   oxybutynin 5 MG tablet Commonly known as: DITROPAN Take 5 mg by mouth every morning.   PARoxetine 40 MG tablet Commonly known as: PAXIL Take 40 mg by mouth daily.   sodium bicarbonate 650 MG tablet Take 2 tablets (1,300 mg total) by mouth 2 (two) times daily for 10 days. Notes to patient: ** NEW ** To help kidneys and lower acid in blood   venlafaxine XR 37.5 MG 24 hr capsule Commonly known as: EFFEXOR-XR Take 37.5 mg by mouth daily with breakfast.       Follow-up Information    Bjorn Pippin, MD.   Specialty: Urology Why: My office will call to arrange your next procedure for 2-3 weeks from now.  Contact information: [address] [place] [address] [phone]              Allergies  Allergen Reactions  . Arlice Colt Isothiocyanate] Anaphylaxis  . Hydrochlorothiazide Hives, Swelling and Other (See Comments)    "swells, turns red, feels like it's burning"  . Latex Hives, Itching and Dermatitis    Consultations: Urology  Discharge Exam: BP 140/85 (BP Location: Right Arm)   Pulse (!) 106   Temp 98.7 F (37.1 C) (Oral)   Resp 17   Ht 4\' 11"  (1.499 m)   Wt 81.6 kg   LMP  (LMP Unknown)   SpO2 97%   BMI 36.33 kg/m  General appearance: alert, cooperative and no distress Head: Normocephalic, without obvious abnormality, atraumatic Eyes: EOMI Lungs: clear to auscultation bilaterally Heart: regular rate and rhythm and S1, S2 normal Abdomen:  normal findings: bowel sounds normal Extremities: no edema Skin: mobility and turgor normal Neurologic: Grossly normal  The results of significant diagnostics from this hospitalization (including imaging, microbiology, ancillary and laboratory) are listed below for reference.   Microbiology: Recent Results (from the past 240 hour(s))  SARS Coronavirus 2 by RT PCR (hospital order, performed in [facility] hospital lab) Nasopharyngeal Nasopharyngeal Swab     Status: None   Collection Time: 02/08/20  1:14 AM   Specimen: Nasopharyngeal Swab  Result Value Ref Range Status   SARS Coronavirus 2 NEGATIVE NEGATIVE Final    Comment: (NOTE) SARS-CoV-2 target nucleic acids are NOT DETECTED.  The SARS-CoV-2 RNA is generally detectable in upper and lower respiratory specimens during the acute phase of infection. The lowest concentration of SARS-CoV-2 viral copies this assay can detect is 250 copies / mL. A negative result does not preclude SARS-CoV-2 infection and should not be used as the sole basis for treatment or other patient management decisions.  A negative result may occur with improper specimen collection / handling, submission of  specimen other than nasopharyngeal swab, presence of viral mutation(s) within the areas targeted by this assay, and inadequate number of viral copies (<250 copies / mL). A negative result must be combined with clinical observations, patient history, and epidemiological information.  Fact Sheet for Patients:   BoilerBrush.com.cy  Fact Sheet for Healthcare Providers: https://pope.com/  This test is not yet approved or  cleared by the [place] FDA and has been authorized for detection and/or diagnosis of SARS-CoV-2 by FDA under an Emergency Use Authorization (EUA).  This EUA will remain in effect (meaning this test can be used) for the duration of the COVID-19 declaration under Section 564(b)(1) of the Act,  21 U.S.C. section 360bbb-3(b)(1), unless the authorization is terminated or revoked sooner.  Performed at [facility], [address] [place], [place] [address]   Urine culture     Status: Abnormal   Collection Time: 02/08/20  3:57 AM   Specimen: Urine, Random  Result Value Ref Range Status   Specimen Description URINE, RANDOM  Final   Special Requests   Final    NONE Performed at [facility], [address] [place], [place] [address]    Culture MULTIPLE SPECIES PRESENT, SUGGEST RECOLLECTION (A)  Final   Report Status 02/08/2020 FINAL  Final  Urine Culture     Status: None   Collection Time: 02/08/20  5:01 PM   Specimen: Urine, Catheterized  Result Value Ref Range Status   Specimen Description   Final    URINE, CATHETERIZED Performed at [facility], [address] [place], [place] [address]    Special Requests   Final    NONE Performed at [facility], [address], [place], [place] [address]    Culture   Final    NO GROWTH Performed at [facility], [address], [place], [place] [address]    Report Status 02/10/2020 FINAL  Final     Labs: BNP (last 3 results) No results for input(s): BNP in the last 8760 hours. Basic Metabolic Panel: Recent Labs  Lab 02/09/20 0647 02/09/20 1645 02/10/20 0600 02/11/20 0636 02/12/20 0538  NA 132* 135 138 139 139  K 5.1 4.8 4.6 4.2 3.9  CL 108 110 115* 114* 111  CO2 14* 16* 15* 16* 17*  GLUCOSE 133* 105* 91 88 103*  BUN 58* 56* 55* 43* 34*  CREATININE 3.12* 3.08* 2.38* 2.14* 1.92*  CALCIUM 8.5* 8.4* 8.2* 8.3* 8.1*   Liver Function Tests: Recent Labs  Lab 02/07/20 1357 02/08/20 0659  AST 40 29  ALT 54* 46*  ALKPHOS 116 119  BILITOT 0.7 0.3  PROT 8.7* 8.8*  ALBUMIN 3.2* 3.5   No results for input(s): LIPASE, AMYLASE in the last 168 hours. No results for input(s): AMMONIA in the last 168 hours. CBC: Recent Labs  Lab 02/07/20 1357 02/08/20 0659  02/09/20 0647 02/10/20 0600 02/11/20 0636  WBC 13.1* 14.3* 14.9* 17.5* 11.1*  NEUTROABS 9.9*  --   --   --   --   HGB 13.9 13.8 11.6* 11.6* 11.7*  HCT 43.7 42.2 35.6* 36.0 36.3  MCV 92.6 89.4 89.7 92.8 92.4  PLT 387 370 351 335 338   Cardiac Enzymes: No results for input(s): CKTOTAL, CKMB, CKMBINDEX, TROPONINI in the last 168 hours. BNP: Invalid input(s): POCBNP CBG: No results for input(s): GLUCAP in the last 168 hours. D-Dimer No results for input(s): DDIMER in the last 72 hours. Hgb A1c No results for  input(s): HGBA1C in the last 72 hours. Lipid Profile No results for input(s): CHOL, HDL, LDLCALC, TRIG, CHOLHDL, LDLDIRECT in the last 72 hours. Thyroid function studies No results for input(s): TSH, T4TOTAL, T3FREE, THYROIDAB in the last 72 hours.  Invalid input(s): FREET3 Anemia work up No results for input(s): VITAMINB12, FOLATE, FERRITIN, TIBC, IRON, RETICCTPCT in the last 72 hours. Urinalysis    Component Value Date/Time   COLORURINE YELLOW 02/07/2020 1855   APPEARANCEUR CLEAR 02/07/2020 1855   LABSPEC 1.012 02/07/2020 1855   PHURINE 5.0 02/07/2020 1855   GLUCOSEU NEGATIVE 02/07/2020 1855   HGBUR MODERATE (A) 02/07/2020 1855   BILIRUBINUR NEGATIVE 02/07/2020 1855   KETONESUR NEGATIVE 02/07/2020 1855   PROTEINUR NEGATIVE 02/07/2020 1855   NITRITE NEGATIVE 02/07/2020 1855   LEUKOCYTESUR LARGE (A) 02/07/2020 1855   Sepsis Labs Invalid input(s): PROCALCITONIN,  WBC,  LACTICIDVEN Microbiology Recent Results (from the past 240 hour(s))  SARS Coronavirus 2 by RT PCR (hospital order, performed in [facility] hospital lab) Nasopharyngeal Nasopharyngeal Swab     Status: None   Collection Time: 02/08/20  1:14 AM   Specimen: Nasopharyngeal Swab  Result Value Ref Range Status   SARS Coronavirus 2 NEGATIVE NEGATIVE Final    Comment: (NOTE) SARS-CoV-2 target nucleic acids are NOT DETECTED.  The SARS-CoV-2 RNA is generally detectable in upper and lower respiratory  specimens during the acute phase of infection. The lowest concentration of SARS-CoV-2 viral copies this assay can detect is 250 copies / mL. A negative result does not preclude SARS-CoV-2 infection and should not be used as the sole basis for treatment or other patient management decisions.  A negative result may occur with improper specimen collection / handling, submission of specimen other than nasopharyngeal swab, presence of viral mutation(s) within the areas targeted by this assay, and inadequate number of viral copies (<250 copies / mL). A negative result must be combined with clinical observations, patient history, and epidemiological information.  Fact Sheet for Patients:   BoilerBrush.com.cyhttps://www.fda.gov/media/136312/download  Fact Sheet for Healthcare Providers: https://pope.com/https://www.fda.gov/media/136313/download  This test is not yet approved or  cleared by the [place] FDA and has been authorized for detection and/or diagnosis of SARS-CoV-2 by FDA under an Emergency Use Authorization (EUA).  This EUA will remain in effect (meaning this test can be used) for the duration of the COVID-19 declaration under Section 564(b)(1) of the Act, 21 U.S.C. section 360bbb-3(b)(1), unless the authorization is terminated or revoked sooner.  Performed at [facility], [address] [place], [place] [address]   Urine culture     Status: Abnormal   Collection Time: 02/08/20  3:57 AM   Specimen: Urine, Random  Result Value Ref Range Status   Specimen Description URINE, RANDOM  Final   Special Requests   Final    NONE Performed at [facility], [address] [place], [place] [address]    Culture MULTIPLE SPECIES PRESENT, SUGGEST RECOLLECTION (A)  Final   Report Status 02/08/2020 FINAL  Final  Urine Culture     Status: None   Collection Time: 02/08/20  5:01 PM   Specimen: Urine, Catheterized  Result Value Ref Range Status   Specimen Description   Final    URINE,  CATHETERIZED Performed at [facility], [address] [place], [place] [address]    Special Requests   Final    NONE Performed at [facility], [address] [place], [place] [address]    Culture   Final  NO GROWTH Performed at [facility], [address], [place], [place] [address]    Report Status 02/10/2020 FINAL  Final    Procedures/Studies: DG C-Arm 1-60 Min-No Report  Result Date: 02/08/2020 Fluoroscopy was utilized by the requesting physician.  No radiographic interpretation.   CT Renal Stone Study  Result Date: 02/08/2020 CLINICAL DATA:  Acute renal failure, emesis, hematuria EXAM: CT ABDOMEN AND PELVIS WITHOUT CONTRAST TECHNIQUE: Multidetector CT imaging of the abdomen and pelvis was performed following the standard protocol without IV contrast. COMPARISON:  12/15/2019 FINDINGS: Lower chest: The visualized lung bases are clear bilaterally. Extensive coronary artery calcification within the right coronary artery. Extensive calcification of the mitral valve annulus. Global cardiac size within normal limits. Hepatobiliary: Tiny cyst within the right hepatic dome. Liver and gallbladder are otherwise unremarkable. No intra or extrahepatic biliary ductal dilation. Pancreas: Unremarkable Spleen: Unremarkable Adrenals/Urinary Tract: The adrenal glands are unremarkable. There is moderate asymmetric cortical atrophy of the left kidney. The right kidney is normal in size and position. There is moderate right hydronephrosis again identified with marked hydroureter to the level of the distal right ureter where, similar to prior examination, there is intraluminal hyperdensity. This could reflect calcific debris within the distal right ureter, though its persistent since remote prior examination raises the question of an underlying stricture. Additional considerations such as dystrophic calcification secondary to prior intervention or trauma, or  metaplastic calcification within a neoplasm are unusual. The bladder is decompressed. No hydronephrosis is seen on the left. Stomach/Bowel: Stomach, small bowel, and large bowel are unremarkable. Appendix normal. No free intraperitoneal gas or fluid. Vascular/Lymphatic: There is extensive aortoiliac atherosclerotic calcification without evidence of aneurysm. Particularly prominent calcification is seen at the origin of the mesenteric vessels, though the degree of stenosis is not well assessed on this non arteriographic examination. No pathologic adenopathy within the abdomen and pelvis. Reproductive: The uterus is absent. No adnexal masses. Radiopacities seen within the pelvic floor posterior to the pubic symphysis may relate to bladder suspension surgery. Other: The rectum is unremarkable. Musculoskeletal: Degenerative changes are seen within the lumbar spine. No lytic or blastic bone lesions are seen. IMPRESSION: 1. Persistent moderate right hydronephrosis and hydroureter to the level of the distal right ureter, where there is intraluminal hyperdensity. This could reflect calcific debris within the distal right ureter, though its persistent since remote prior examination raises the question of an underlying stricture. Additional considerations such as dystrophic calcification secondary to prior intervention or trauma, or metaplastic calcification within a neoplasm are unusual. 2. Extensive aortoiliac atherosclerotic calcification. Particularly prominent calcification is seen at the origin of the mesenteric vessels, though the degree of stenosis is not well assessed on this non arteriographic examination. Aortic Atherosclerosis (ICD10-I70.0). Electronically Signed   By: Helyn Numbers MD   On: 02/08/2020 02:38     Time coordinating discharge: Over 30 minutes    Lewie Chamber, MD  [facility] 02/13/2020, 5:47 PM Pager: Secure chat  If 7PM-7AM, please contact night-coverage [email or web address] Password  [email or web address]

## 2020-02-14 ENCOUNTER — Other Ambulatory Visit: Payer: Self-pay | Admitting: Urology

## 2020-02-21 NOTE — Patient Instructions (Addendum)
DUE TO COVID-19 ONLY ONE VISITOR IS ALLOWED TO COME WITH YOU AND STAY IN THE WAITING ROOM ONLY DURING PRE OP AND PROCEDURE.   IF YOU WILL BE ADMITTED INTO THE HOSPITAL YOU ARE ALLOWED ONE SUPPORT PERSON DURING VISITATION HOURS ONLY (10AM -8PM)   . The support person may change daily. . The support person must pass our screening, gel in and out, and wear a mask at all times, including in the patient's room. . Patients must also wear a mask when staff or their support person are in the room.   COVID SWAB TESTING MUST BE COMPLETED ON:  Friday, 02-29-20 @ 1:05 PM    [address] [place], [place] [address]  (Must self quarantine after testing. Follow instructions on  handout.)        Your procedure is scheduled on: Tuesday, 03-04-20   Report to [facility] Main  Entrance    Report to admitting at 6:45 AM   Call this number if you have problems the morning of surgery [phone]   Do not eat food :After Midnight.   May have liquids until 5:45 AM  day of surgery   CLEAR LIQUID DIET  Foods Allowed                                                                     Foods Excluded  Water, Black Coffee and tea, regular and decaf          liquids that you cannot  Plain Jell-O in any flavor  (No red)                                     see through such as: Fruit ices (not with fruit pulp)                                      milk, soups, orange juice              Iced Popsicles (No red)                                      All solid food                                   Apple juices Sports drinks like Gatorade (No red) Lightly seasoned clear broth or consume(fat free)  Sugar, honey syrup      Oral Hygiene is also important to reduce your risk of infection.                                    Remember - BRUSH YOUR TEETH THE MORNING OF SURGERY WITH YOUR REGULAR TOOTHPASTE   Do NOT smoke after Midnight   Take these medicines the morning of surgery with A SIP OF WATER:  Zyrtec,  Metoprolol, Oxybutynin, Paroxetine,  Venalafaxine.  Okay  to use inhalers and bring them with you to hospital day of surgery                                You may not have any metal on your body including hair pins, jewelry, and body piercings             Do not wear make-up, lotions, powders, perfumes/cologne, or deodorant             Do not wear nail polish.  Do not shave  48 hours prior to surgery.               Do not bring valuables to the hospital. [facility] IS NOT RESPONSIBLE   FOR VALUABLES.   Contacts, dentures or bridgework may not be worn into surgery.     Patients discharged the day of surgery will not be allowed to drive home.               Please read over the following fact sheets you were given: IF YOU HAVE QUESTIONS ABOUT YOUR PRE OP INSTRUCTIONS PLEASE CALL [phone]   [facility] - Preparing for Surgery Before surgery, you can play an important role.  Because skin is not sterile, your skin needs to be as free of germs as possible.  You can reduce the number of germs on your skin by washing with CHG (chlorahexidine gluconate) soap before surgery.  CHG is an antiseptic cleaner which kills germs and bonds with the skin to continue killing germs even after washing. Please DO NOT use if you have an allergy to CHG or antibacterial soaps.  If your skin becomes reddened/irritated stop using the CHG and inform your nurse when you arrive at Short Stay. Do not shave (including legs and underarms) for at least 48 hours prior to the first CHG shower.  You may shave your face/neck.  Please follow these instructions carefully:  1.  Shower with CHG Soap the night before surgery and the  morning of surgery.  2.  If you choose to wash your hair, wash your hair first as usual with your normal  shampoo.  3.  After you shampoo, rinse your hair and body thoroughly to remove the shampoo.                             4.  Use CHG as you would any other liquid soap.  You can apply chg  directly to the skin and wash.  Gently with a scrungie or clean washcloth.  5.  Apply the CHG Soap to your body ONLY FROM THE NECK DOWN.   Do   not use on face/ open                           Wound or open sores. Avoid contact with eyes, ears mouth and   genitals (private parts).                       Wash face,  Genitals (private parts) with your normal soap.             6.  Wash thoroughly, paying special attention to the area where your    surgery  will be performed.  7.  Thoroughly rinse your body with warm water from the neck down.  8.  DO NOT shower/wash with your normal soap after using and rinsing off the CHG Soap.                9.  Pat yourself dry with a clean towel.            10.  Wear clean pajamas.            11.  Place clean sheets on your bed the night of your first shower and do not  sleep with pets. Day of Surgery : Do not apply any lotions/deodorants the morning of surgery.  Please wear clean clothes to the hospital/surgery center.  FAILURE TO FOLLOW THESE INSTRUCTIONS MAY RESULT IN THE CANCELLATION OF YOUR SURGERY  PATIENT SIGNATURE_________________________________  NURSE SIGNATURE__________________________________  ________________________________________________________________________

## 2020-02-21 NOTE — Progress Notes (Addendum)
COVID Vaccine Completed: NO Date COVID Vaccine completed: COVID vaccine manufacturer: Pfizer    [employer]   PCP - Lady Deutscher, FNP Cardiologist - Tobias Alexander, MD [facility]  Chest x-ray -  EKG - 08-07-19 at [facility].  Copy on Chart   ECHO - 10-04-19 [facility]  (copy on chart)  Cardiac Cath - 09-27-17 in Epic  14 Day ZIO Patch Monitor -08-07-19 Care Everywhere  Sleep Study - 10+ years ago.  Borderline sleep apnea CPAP -  No  Fasting Blood Sugar -  Checks Blood Sugar _____ times a day  Blood Thinner Instructions: Aspirin Instructions: Last Dose:  Anesthesia review:   CAD, unstable angina, NSTEMI, CHF & COPD  Patient denies shortness of breath, fever, cough and chest pain at PAT appointment   Patient verbalized understanding of instructions that were given to them at the PAT appointment. Patient was also instructed that they will need to review over the PAT instructions again at home before surgery.

## 2020-02-25 ENCOUNTER — Other Ambulatory Visit: Payer: Self-pay

## 2020-02-25 ENCOUNTER — Encounter (HOSPITAL_COMMUNITY): Payer: Self-pay

## 2020-02-25 ENCOUNTER — Encounter (HOSPITAL_COMMUNITY)
Admission: RE | Admit: 2020-02-25 | Discharge: 2020-02-25 | Disposition: A | Discharge status: Home / Self Care | Payer: Medicare Other | Source: Ambulatory Visit | Attending: Urology | Admitting: Urology

## 2020-02-25 DIAGNOSIS — Z01812 Encounter for preprocedural laboratory examination: Secondary | ICD-10-CM | POA: Insufficient documentation

## 2020-02-25 HISTORY — DX: Prediabetes: R73.03

## 2020-02-25 HISTORY — DX: Anemia, unspecified: D64.9

## 2020-02-25 HISTORY — DX: Personal history of urinary calculi: Z87.442

## 2020-02-25 HISTORY — DX: Unspecified asthma, uncomplicated: J45.909

## 2020-02-25 HISTORY — DX: Acute myocardial infarction, unspecified: I21.9

## 2020-02-28 NOTE — Progress Notes (Signed)
Anesthesia Chart Review   Case: [id number] Date/Time: 03/04/20 0830   Procedure: CYSTOSCOPY/URETEROSCOPY/HOLMIUM LASER/STENT EXCHANGE (Right ) - ONLY NEEDS 60 MIN   Anesthesia type: General   Pre-op diagnosis: RIGHT DISTAL STONES   Location: [facility] ROOM 03 / [facility]   Surgeons: Bjorn Pippin, MD      DISCUSSION:69 y.o. current every day smoker with h/o HTN, sleep apnea, COPD, asthma, NSTEMI 2019, nonobstructive CAD, CKD stage III, acute renal failure due to obstructive renal stones s/p right ureteral stent 02/08/2020 during recent admission scheduled for above procedure 03/04/2020 with Dr. Bjorn Pippin.   Last seen by PCP 02/13/2020.   Last seen by cardiology 08/07/2019.  Echo 10/04/2019 with EF 65-70%, Mild pulmonary hypertension. Estimated right ventricular systolic  pressure is 38 mmHg.  S/p cystoscopy with right stent placement.  ASA IV, no anesthesia complications noted.   Anticipate pt can proceed with planned procedure barring acute status change.   VS: BP (!) 147/57   Pulse 81   Temp 36.6 C (Oral)   Resp 16   Ht 4\' 11"  (1.499 m)   Wt 80.1 kg   LMP  (LMP Unknown)   SpO2 100%   BMI 35.67 kg/m   PROVIDERS: Ward, , FNP is PCP   Clois Comber, MD is Cardiologist  LABS: Labs reviewed: Acceptable for surgery. (all labs ordered are listed, but only abnormal results are displayed)  Labs Reviewed - No data to display   IMAGES:   EKG: EKG on chart  CV: Echo 10/04/2019 SUMMARY  The left ventricular size is normal. Mild left ventricular hypertrophy  Left ventricular systolic function is normal. LV ejection fraction =  65-70%.  The right ventricle is normal in size and function.  The left atrium is mildly dilated.  The aortic valve is trileaflet. There is no aortic stenosis. There is  mild aortic regurgitation.  There is mild to moderate mitral annular calcification. There is mild  mitral regurgitation.  Structurally normal tricuspid valve. There is mild tricuspid   regurgitation.  Mild pulmonary hypertension. Estimated right ventricular systolic  pressure is 38 mmHg.  IVC size was normal.  There is no pericardial effusion.    Cardiac Cath 09/27/2017  Ost Ramus lesion is 50% stenosed.  Ramus lesion is 60% stenosed.  Prox RCA lesion is 30% stenosed.  Prox RCA to Mid RCA lesion is 45% stenosed.  Mid RCA lesion is 20% stenosed.  LV end diastolic pressure is normal.  The left ventricular systolic function is normal.   Preserved global LV function with an ejection fraction of 60-65% and a small region of mid distal inferior hypocontractility.  Evidence for coronary calcification involving the left main, LAD and RCA.  Mild 10-20% focal narrowing at the ostium of the first diagonal branch of the LAD; 50-60% stenosis in a small caliber ramus intermediate vessel, less than 2 mm in diameter; normal left circumflex Artery; dominant RCA with mild calcification and 30% proximal 40-50% mid 20% stenosis proximal to the acute margin.  RECOMMENDATION: Medical therapy will be initiated.  Smoking cessation is essential.  High potency statin therapy in attempt to induce plaque regression.  Past Medical History:  Diagnosis Date  . Anemia    In early 20s  . Anxiety   . Arthritis   . Asthma   . Chest pain 09/2017  . COPD (chronic obstructive pulmonary disease) (HCC)   . Depression   . History of kidney stones   . HTN (hypertension)   . Myocardial infarction (HCC)   .  Obesity, Class III, BMI 40-49.9 (morbid obesity) (HCC)   . Pre-diabetes   . Sleep apnea    history of sleep apnea  . Smoking     Past Surgical History:  Procedure Laterality Date  . ABDOMINAL HYSTERECTOMY    . BREAST LUMPECTOMY    . BREAST LUMPECTOMY    . CARPAL TUNNEL RELEASE     bilteral  . CERVICAL DISC SURGERY    . COLONOSCOPY  07/2017  . colonoscopy with polypectomy    . CYSTOSCOPY W/ URETERAL STENT PLACEMENT Right 02/08/2020   Procedure: CYSTOSCOPY WITH RETROGRADE  PYELOGRAM/URETERAL STENT PLACEMENT;  Surgeon: Bjorn Pippin, MD;  Location: [facility];  Service: Urology;  Laterality: Right;  . HEMORROIDECTOMY    . LEFT HEART CATH AND CORONARY ANGIOGRAPHY N/A 09/27/2017   Procedure: LEFT HEART CATH AND CORONARY ANGIOGRAPHY;  Surgeon: Lennette Bihari, MD;  Location: [facility];  Service: Cardiovascular;  Laterality: N/A;  . MULTIPLE TOOTH EXTRACTIONS    . TONSILLECTOMY      MEDICATIONS: . albuterol (PROVENTIL HFA;VENTOLIN HFA) 108 (90 Base) MCG/ACT inhaler  . albuterol (PROVENTIL) (2.5 MG/3ML) 0.083% nebulizer solution  . atorvastatin (LIPITOR) 80 MG tablet  . cetirizine (ZYRTEC) 10 MG tablet  . ezetimibe (ZETIA) 10 MG tablet  . fluticasone (FLONASE) 50 MCG/ACT nasal spray  . lisinopril (PRINIVIL,ZESTRIL) 5 MG tablet  . metoprolol tartrate (LOPRESSOR) 50 MG tablet  . naproxen sodium (ALEVE) 220 MG tablet  . nitroGLYCERIN (NITROSTAT) 0.3 MG SL tablet  . nystatin (MYCOSTATIN/NYSTOP) powder  . ondansetron (ZOFRAN ODT) 4 MG disintegrating tablet  . oxybutynin (DITROPAN) 5 MG tablet  . PARoxetine (PAXIL) 40 MG tablet  . venlafaxine XR (EFFEXOR-XR) 37.5 MG 24 hr capsule   No current facility-administered medications for this encounter.    Jodell Cipro, PA-C [facility] [phone]

## 2020-02-29 ENCOUNTER — Other Ambulatory Visit (HOSPITAL_COMMUNITY)
Admission: RE | Admit: 2020-02-29 | Discharge: 2020-02-29 | Disposition: A | Discharge status: Home / Self Care | Payer: Medicare Other | Source: Ambulatory Visit | Attending: Urology | Admitting: Urology

## 2020-02-29 DIAGNOSIS — Z01812 Encounter for preprocedural laboratory examination: Secondary | ICD-10-CM | POA: Diagnosis present

## 2020-02-29 DIAGNOSIS — Z20822 Contact with and (suspected) exposure to covid-19: Secondary | ICD-10-CM | POA: Diagnosis not present

## 2020-02-29 LAB — SARS CORONAVIRUS 2 (TAT 6-24 HRS): SARS Coronavirus 2: NEGATIVE

## 2020-03-03 NOTE — Anesthesia Preprocedure Evaluation (Addendum)
Anesthesia Evaluation  Patient identified by MRN, date of birth, ID band Patient awake    Reviewed: Allergy & Precautions, NPO status , Patient's Chart, lab work & pertinent test results  Airway Mallampati: III  TM Distance: >3 FB Neck ROM: Full    Dental no notable dental hx. (+) Edentulous Upper, Poor Dentition, Loose,    Pulmonary sleep apnea , COPD,  COPD inhaler, Current SmokerPatient did not abstain from smoking.,    Pulmonary exam normal breath sounds clear to auscultation       Cardiovascular hypertension, Pt. on home beta blockers and Pt. on medications + CAD, + Past MI and +CHF  Normal cardiovascular exam Rhythm:Regular Rate:Normal     Neuro/Psych PSYCHIATRIC DISORDERS Anxiety Depression negative neurological ROS     GI/Hepatic negative GI ROS, Neg liver ROS,   Endo/Other  negative endocrine ROS  Renal/GU Renal InsufficiencyRenal diseaseK+ 3.9 Cr 1.92     Musculoskeletal  (+) Arthritis ,   Abdominal (+) + obese,   Peds  Hematology  (+) anemia , Hgb 11.7   Anesthesia Other Findings   Reproductive/Obstetrics                            Anesthesia Physical Anesthesia Plan  ASA: IV  Anesthesia Plan: General   Post-op Pain Management:    Induction: Intravenous  PONV Risk Score and Plan: 3 and Treatment may vary due to age or medical condition, Ondansetron, Dexamethasone and Midazolam  Airway Management Planned: LMA  Additional Equipment: None  Intra-op Plan:   Post-operative Plan: Extubation in OR  Informed Consent: I have reviewed the patients History and Physical, chart, labs and discussed the procedure including the risks, benefits and alternatives for the proposed anesthesia with the patient or authorized representative who has indicated his/her understanding and acceptance.     Dental advisory given  Plan Discussed with: CRNA and Anesthesiologist  Anesthesia  Plan Comments:        Anesthesia Quick Evaluation

## 2020-03-04 ENCOUNTER — Encounter (HOSPITAL_COMMUNITY): Payer: Self-pay | Admitting: Urology

## 2020-03-04 ENCOUNTER — Ambulatory Visit (HOSPITAL_COMMUNITY): Payer: Medicare Other | Admitting: Physician Assistant

## 2020-03-04 ENCOUNTER — Ambulatory Visit (HOSPITAL_COMMUNITY)
Admission: RE | Admit: 2020-03-04 | Discharge: 2020-03-04 | Disposition: A | Discharge status: Home / Self Care | Payer: Medicare Other | Source: Ambulatory Visit | Attending: Urology | Admitting: Urology

## 2020-03-04 ENCOUNTER — Ambulatory Visit (HOSPITAL_COMMUNITY): Payer: Medicare Other

## 2020-03-04 ENCOUNTER — Encounter (HOSPITAL_COMMUNITY)
Admission: RE | Disposition: A | Discharge status: Home / Self Care | Payer: Self-pay | Source: Ambulatory Visit | Attending: Urology

## 2020-03-04 ENCOUNTER — Ambulatory Visit (HOSPITAL_COMMUNITY): Payer: Medicare Other | Admitting: Anesthesiology

## 2020-03-04 DIAGNOSIS — I110 Hypertensive heart disease with heart failure: Secondary | ICD-10-CM | POA: Insufficient documentation

## 2020-03-04 DIAGNOSIS — I509 Heart failure, unspecified: Secondary | ICD-10-CM | POA: Diagnosis not present

## 2020-03-04 DIAGNOSIS — M1990 Unspecified osteoarthritis, unspecified site: Secondary | ICD-10-CM | POA: Diagnosis not present

## 2020-03-04 DIAGNOSIS — D649 Anemia, unspecified: Secondary | ICD-10-CM | POA: Insufficient documentation

## 2020-03-04 DIAGNOSIS — G4730 Sleep apnea, unspecified: Secondary | ICD-10-CM | POA: Insufficient documentation

## 2020-03-04 DIAGNOSIS — J449 Chronic obstructive pulmonary disease, unspecified: Secondary | ICD-10-CM | POA: Insufficient documentation

## 2020-03-04 DIAGNOSIS — N210 Calculus in bladder: Secondary | ICD-10-CM | POA: Diagnosis not present

## 2020-03-04 DIAGNOSIS — I252 Old myocardial infarction: Secondary | ICD-10-CM | POA: Insufficient documentation

## 2020-03-04 DIAGNOSIS — Z6835 Body mass index (BMI) 35.0-35.9, adult: Secondary | ICD-10-CM | POA: Insufficient documentation

## 2020-03-04 DIAGNOSIS — F329 Major depressive disorder, single episode, unspecified: Secondary | ICD-10-CM | POA: Diagnosis not present

## 2020-03-04 DIAGNOSIS — E669 Obesity, unspecified: Secondary | ICD-10-CM | POA: Diagnosis not present

## 2020-03-04 DIAGNOSIS — I2510 Atherosclerotic heart disease of native coronary artery without angina pectoris: Secondary | ICD-10-CM | POA: Insufficient documentation

## 2020-03-04 DIAGNOSIS — F419 Anxiety disorder, unspecified: Secondary | ICD-10-CM | POA: Diagnosis not present

## 2020-03-04 HISTORY — PX: CYSTOSCOPY/URETEROSCOPY/HOLMIUM LASER/STENT PLACEMENT: SHX6546

## 2020-03-04 LAB — CBC
HCT: 39.8 % (ref 36.0–46.0)
Hemoglobin: 12.9 g/dL (ref 12.0–15.0)
MCH: 29.2 pg (ref 26.0–34.0)
MCHC: 32.4 g/dL (ref 30.0–36.0)
MCV: 90 fL (ref 80.0–100.0)
Platelets: 346 10*3/uL (ref 150–400)
RBC: 4.42 MIL/uL (ref 3.87–5.11)
RDW: 14 % (ref 11.5–15.5)
WBC: 9.9 10*3/uL (ref 4.0–10.5)
nRBC: 0 % (ref 0.0–0.2)

## 2020-03-04 SURGERY — CYSTOSCOPY/URETEROSCOPY/HOLMIUM LASER/STENT PLACEMENT
Anesthesia: General | Laterality: Right

## 2020-03-04 MED ORDER — SODIUM CHLORIDE 0.9% FLUSH: 3.0000 mL | Freq: Two times a day (BID) | INTRAVENOUS | Status: DC

## 2020-03-04 MED ORDER — METOPROLOL TARTRATE 50 MG PO TABS
50.0000 mg | ORAL_TABLET | Freq: Once | ORAL | Status: AC
  Administered 2020-03-04: 50 mg via ORAL
  Filled 2020-03-04: qty 1

## 2020-03-04 MED ORDER — SODIUM CHLORIDE 0.9 % IR SOLN
Status: DC | PRN
  Administered 2020-03-04: 3000 mL via INTRAVESICAL

## 2020-03-04 MED ORDER — EPHEDRINE SULFATE-NACL 50-0.9 MG/10ML-% IV SOSY
PREFILLED_SYRINGE | INTRAVENOUS | Status: DC | PRN
  Administered 2020-03-04: 10 mg via INTRAVENOUS

## 2020-03-04 MED ORDER — LACTATED RINGERS IV SOLN: INTRAVENOUS | Status: DC

## 2020-03-04 MED ORDER — LIDOCAINE 2% (20 MG/ML) 5 ML SYRINGE
INTRAMUSCULAR | Status: DC | PRN
  Administered 2020-03-04: 80 mg via INTRAVENOUS

## 2020-03-04 MED ORDER — ORAL CARE MOUTH RINSE: 15.0000 mL | Freq: Once | OROMUCOSAL | Status: AC

## 2020-03-04 MED ORDER — HYDROCODONE-ACETAMINOPHEN 5-325 MG PO TABS
1.0000 | ORAL_TABLET | Freq: Four times a day (QID) | ORAL | 0 refills | Status: AC | PRN
Start: 2020-03-04 — End: 2021-03-04

## 2020-03-04 MED ORDER — MIDAZOLAM HCL 2 MG/2ML IJ SOLN
INTRAMUSCULAR | Status: AC
  Filled 2020-03-04: qty 2

## 2020-03-04 MED ORDER — FENTANYL CITRATE (PF) 100 MCG/2ML IJ SOLN
INTRAMUSCULAR | Status: AC
  Filled 2020-03-04: qty 2

## 2020-03-04 MED ORDER — ONDANSETRON HCL 4 MG/2ML IJ SOLN
INTRAMUSCULAR | Status: DC | PRN
  Administered 2020-03-04: 4 mg via INTRAVENOUS

## 2020-03-04 MED ORDER — CHLORHEXIDINE GLUCONATE 0.12 % MT SOLN
15.0000 mL | Freq: Once | OROMUCOSAL | Status: AC
  Administered 2020-03-04: 15 mL via OROMUCOSAL

## 2020-03-04 MED ORDER — DEXAMETHASONE SODIUM PHOSPHATE 10 MG/ML IJ SOLN
INTRAMUSCULAR | Status: DC | PRN
  Administered 2020-03-04: 10 mg via INTRAVENOUS

## 2020-03-04 MED ORDER — ONDANSETRON HCL 4 MG/2ML IJ SOLN
INTRAMUSCULAR | Status: AC
  Filled 2020-03-04: qty 2

## 2020-03-04 MED ORDER — MIDAZOLAM HCL 2 MG/2ML IJ SOLN
INTRAMUSCULAR | Status: DC | PRN
  Administered 2020-03-04: 2 mg via INTRAVENOUS

## 2020-03-04 MED ORDER — PROPOFOL 500 MG/50ML IV EMUL
INTRAVENOUS | Status: DC | PRN
  Administered 2020-03-04: 120 mg via INTRAVENOUS

## 2020-03-04 MED ORDER — CEPHALEXIN 500 MG PO CAPS
500.0000 mg | ORAL_CAPSULE | Freq: Three times a day (TID) | ORAL | 0 refills | Status: AC
Start: 2020-03-04 — End: 2020-03-09

## 2020-03-04 MED ORDER — LIDOCAINE 2% (20 MG/ML) 5 ML SYRINGE
INTRAMUSCULAR | Status: AC
  Filled 2020-03-04: qty 5

## 2020-03-04 MED ORDER — DEXAMETHASONE SODIUM PHOSPHATE 10 MG/ML IJ SOLN
INTRAMUSCULAR | Status: AC
  Filled 2020-03-04: qty 1

## 2020-03-04 MED ORDER — CEFAZOLIN SODIUM-DEXTROSE 2-4 GM/100ML-% IV SOLN
2.0000 g | Freq: Once | INTRAVENOUS | Status: AC
  Administered 2020-03-04: 2 g via INTRAVENOUS
  Filled 2020-03-04: qty 100

## 2020-03-04 MED ORDER — PROPOFOL 1000 MG/100ML IV EMUL
INTRAVENOUS | Status: AC
  Filled 2020-03-04: qty 100

## 2020-03-04 SURGICAL SUPPLY — 22 items
BAG URO CATCHER STRL LF (MISCELLANEOUS) ×3 IMPLANT
BASKET STONE NCOMPASS (UROLOGICAL SUPPLIES) IMPLANT
CATH URET 5FR 28IN OPEN ENDED (CATHETERS) ×2 IMPLANT
CATH URET DUAL LUMEN 6-10FR 50 (CATHETERS) IMPLANT
CLOTH BEACON ORANGE TIMEOUT ST (SAFETY) ×3 IMPLANT
EXTRACTOR STONE NITINOL NGAGE (UROLOGICAL SUPPLIES) IMPLANT
GLOVE SURG SS PI 8.0 STRL IVOR (GLOVE) ×3 IMPLANT
GOWN STRL REUS W/TWL XL LVL3 (GOWN DISPOSABLE) ×3 IMPLANT
GUIDEWIRE STR DUAL SENSOR (WIRE) ×3 IMPLANT
IV NS IRRIG 3000ML ARTHROMATIC (IV SOLUTION) ×3 IMPLANT
KIT TURNOVER KIT A (KITS) IMPLANT
LASER FIB FLEXIVA PULSE ID 365 (Laser) IMPLANT
LASER FIB FLEXIVA PULSE ID 550 (Laser) IMPLANT
LASER FIB FLEXIVA PULSE ID 910 (Laser) IMPLANT
MANIFOLD NEPTUNE II (INSTRUMENTS) ×3 IMPLANT
PACK CYSTO (CUSTOM PROCEDURE TRAY) ×3 IMPLANT
SHEATH URETERAL 12FRX35CM (MISCELLANEOUS) IMPLANT
TRACTIP FLEXIVA PULS ID 200XHI (Laser) IMPLANT
TRACTIP FLEXIVA PULSE ID 200 (Laser)
TUBING CONNECTING 10 (TUBING) ×2 IMPLANT
TUBING CONNECTING 10' (TUBING) ×1
TUBING UROLOGY SET (TUBING) ×3 IMPLANT

## 2020-03-04 NOTE — Transfer of Care (Signed)
Immediate Anesthesia Transfer of Care Note  Patient: Jenna [name]  Procedure(s) Performed: Procedure(s) with comments: CYSTOSCOPY WITH REMOVAL OF BLADDER STONE /URETEROSCOPY/ (Right) - ONLY NEEDS 60 MIN  Patient Location: PACU  Anesthesia Type:General  Level of Consciousness: Alert, Awake, Oriented  Airway & Oxygen Therapy: Patient Spontanous Breathing  Post-op Assessment: Report given to RN  Post vital signs: Reviewed and stable  Last Vitals:  Vitals:   03/04/20 0706  BP: (!) 158/79  Pulse: 72  Resp: 16  Temp: 36.9 C  SpO2: 99%    Complications: No apparent anesthesia complications

## 2020-03-04 NOTE — Op Note (Signed)
Procedure: 1.  Cystoscopy with removal of bladder stone, simple. 2.  Cystoscopy with removal of right ureteral stent. 3.  Right diagnostic ureteroscopy. 4.  Application of fluoroscopy.  Preop diagnosis: Right hydronephrosis with probable right distal ureteral stone.  Postop diagnosis: Matrix stone of the bladder and retained right ureteral stent.  Surgeon: Dr. Bjorn Pippin.  Anesthesia: General.  Specimen: Bladder stone.  Drain: None.  EBL: None.  Complications: None.  Indications: The patient is a 69 year old female who was originally seen in late August with right hydronephrosis with a partially calcified lesion in the right distal ureter is felt to be possible stone versus stricture versus neoplasm.  She initially underwent stenting because of associated infection with proteus. and returns now for definitive treatment.  Procedure:  She was given ancef.  A general anesthetic was induced.  She was placed in lithotomy position and fitted with PAS hose.  Her perineum and genitalia were prepped with Betadine solution and she was draped in usual sterile fashion.  Cystoscopy was performed using a 23 [place] scope and 30 degree lens.  Examination revealed a normal urethra.  The bladder wall had mild trabeculation without tumors or significant inflammation.  There was a stent at the right ureteral orifice.  The left ureteral orifice was unremarkable.  There was a tan tubular structure that was approximately 1 x 3 cm and appeared most consistent with matrix and is approximately the size of the lesion that was seen on CT scan.  This was grasped with a grasping forceps and removed largely intact.  The stent was then grasped and pulled the urethral meatus and a sensor wire was advanced to the kidney under fluoroscopic guidance.  The stent was then removed.  The 6.5 French dual-lumen semirigid ureteroscope was then advanced alongside the wire with low irrigation pressure and inspection revealed some  mild inflammatory changes from the stent but no residual matrix material or other stones on inspection up to the lower proximal ureter.  Ureteroscope was then removed along with the wire.  The bladder was drained with the cystoscope sheath.  She was taken down from lithotomy position, her anesthetic was reversed and she was moved recovery in stable condition.  There were no complications.  The matrix stone will be sent for analysis.

## 2020-03-04 NOTE — Anesthesia Postprocedure Evaluation (Signed)
Anesthesia Post Note  Patient: Jenna [name]  Procedure(s) Performed: CYSTOSCOPY WITH REMOVAL OF BLADDER STONE /URETEROSCOPY/ (Right )     Patient location during evaluation: PACU Anesthesia Type: General Level of consciousness: awake and alert Pain management: pain level controlled Vital Signs Assessment: post-procedure vital signs reviewed and stable Respiratory status: spontaneous breathing, nonlabored ventilation, respiratory function stable and patient connected to nasal cannula oxygen Cardiovascular status: blood pressure returned to baseline and stable Postop Assessment: no apparent nausea or vomiting Anesthetic complications: no   No complications documented.  Last Vitals:  Vitals:   03/04/20 0930 03/04/20 0945  BP: (!) 151/91 (!) 160/77  Pulse: 61 67  Resp: 12 12  Temp: 36.4 C 36.5 C  SpO2: 94% 97%    Last Pain:  Vitals:   03/04/20 0945  TempSrc: Oral  PainSc: 0-No pain                 Trevor Iha

## 2020-03-04 NOTE — Discharge Instructions (Signed)

## 2020-03-04 NOTE — Anesthesia Procedure Notes (Signed)
Procedure Name: LMA Insertion Date/Time: 03/04/2020 8:10 AM Performed by: Basilio Cairo, CRNA Pre-anesthesia Checklist: Patient identified, Patient being monitored, Timeout performed, Emergency Drugs available and Suction available Patient Re-evaluated:Patient Re-evaluated prior to induction Oxygen Delivery Method: Circle system utilized Preoxygenation: Pre-oxygenation with 100% oxygen Induction Type: IV induction Ventilation: Mask ventilation without difficulty LMA: LMA inserted LMA Size: 4.0 Tube type: Oral Number of attempts: 1 Placement Confirmation: positive ETCO2 and breath sounds checked- equal and bilateral Tube secured with: Tape Dental Injury: Teeth and Oropharynx as per pre-operative assessment

## 2020-03-04 NOTE — Interval H&P Note (Signed)
History and Physical Interval Note:  No change  03/04/2020 7:56 AM  Jenna [name]  has presented today for surgery, with the diagnosis of RIGHT DISTAL STONES.  The various methods of treatment have been discussed with the patient and family. After consideration of risks, benefits and other options for treatment, the patient has consented to  Procedure(s) with comments: CYSTOSCOPY/URETEROSCOPY/HOLMIUM LASER/STENT EXCHANGE (Right) - ONLY NEEDS 60 MIN as a surgical intervention.  The patient's history has been reviewed, patient examined, no change in status, stable for surgery.  I have reviewed the patient's chart and labs.  Questions were answered to the patient's satisfaction.     Bjorn Pippin

## 2020-03-05 ENCOUNTER — Encounter (HOSPITAL_COMMUNITY): Payer: Self-pay | Admitting: Urology

## 2020-03-11 LAB — CALCULI, WITH PHOTOGRAPH (CLINICAL LAB): Weight Calculi: 142 mg

## 2021-11-01 IMAGING — CT CT RENAL STONE PROTOCOL
2 of 4 series · 16 of 46 positions shown, 18 images · non-contrast
Comparison: 12/15/2019

CLINICAL DATA: Acute renal failure, emesis, hematuria

EXAM:
CT ABDOMEN AND PELVIS WITHOUT CONTRAST
TECHNIQUE: Multidetector CT imaging of the abdomen and pelvis was performed
following the standard protocol without IV contrast.

[Series 3: ap without · axial · non-contrast · 0.79mm/px · z∈[-495,-130]mm · 13 of 83 slices shown, 15 images]
[im 5/83  soft-tissue]
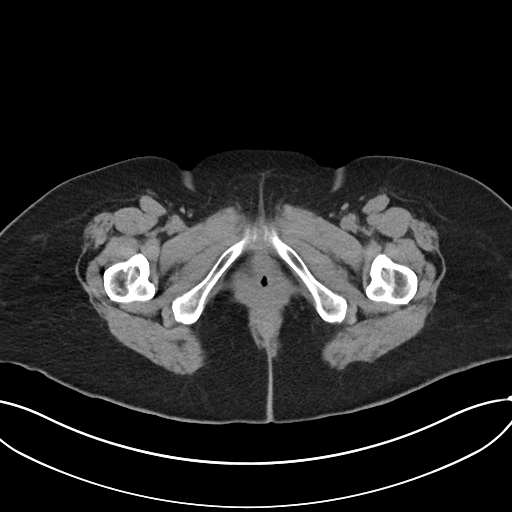
[im 5/83  bone]
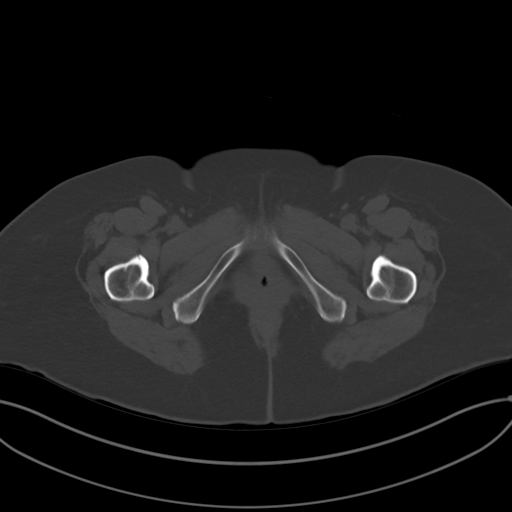
[im 13/83  soft-tissue]
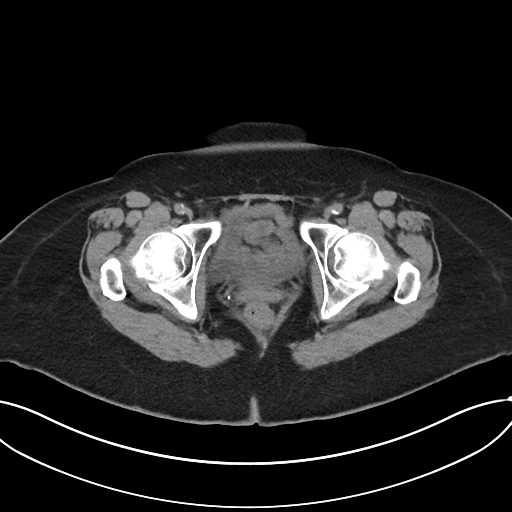
[im 17/83  soft-tissue]
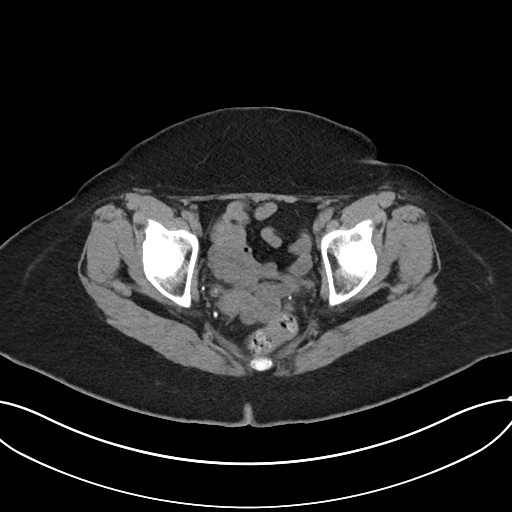
[im 25/83  soft-tissue]
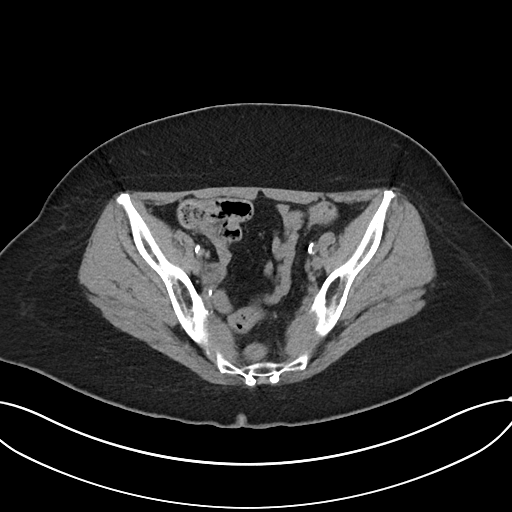
[im 29/83  soft-tissue]
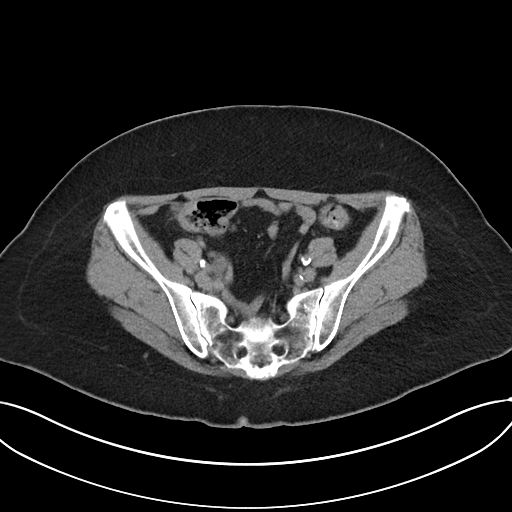
[im 37/83  soft-tissue]
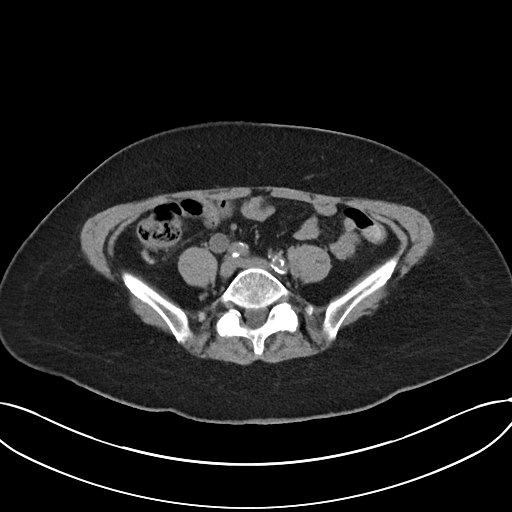
[im 42/83  soft-tissue]
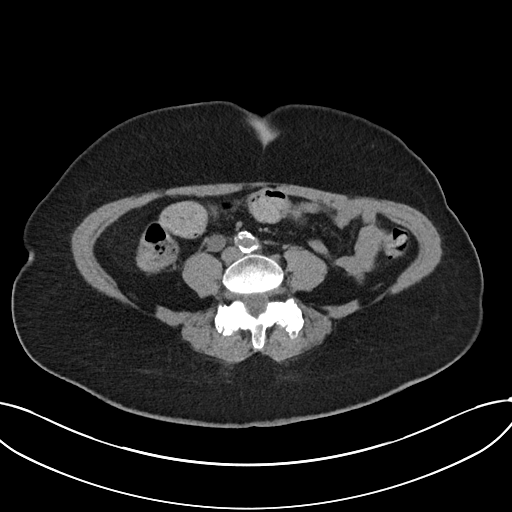
[im 46/83  soft-tissue]
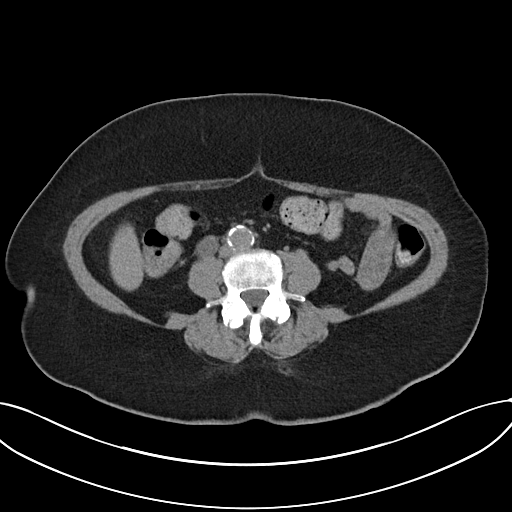
[im 54/83  soft-tissue]
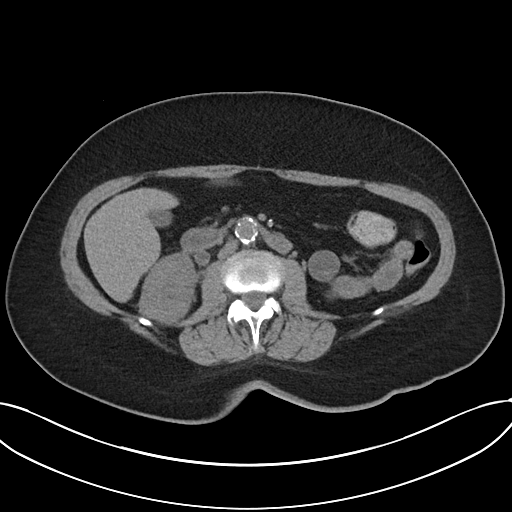
[im 54/83  bone]
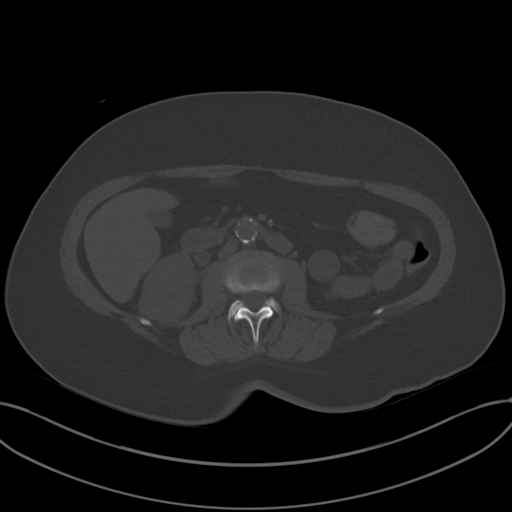
[im 58/83  soft-tissue]
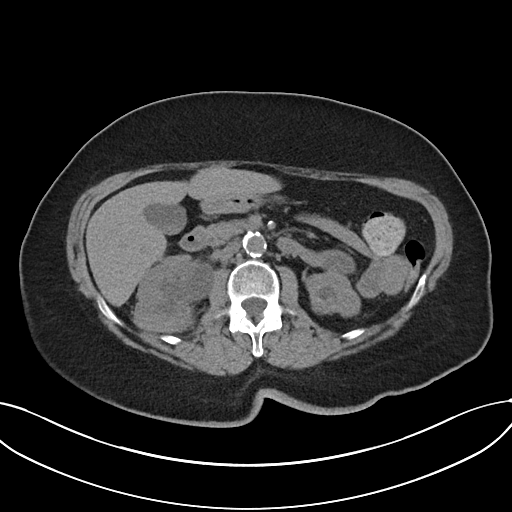
[im 66/83  soft-tissue]
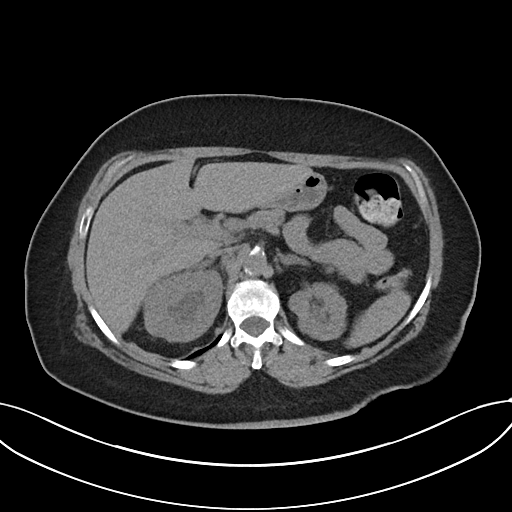
[im 70/83  soft-tissue]
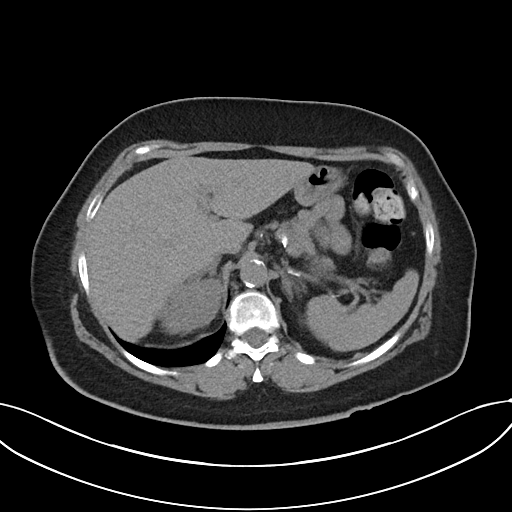
[im 78/83  soft-tissue]
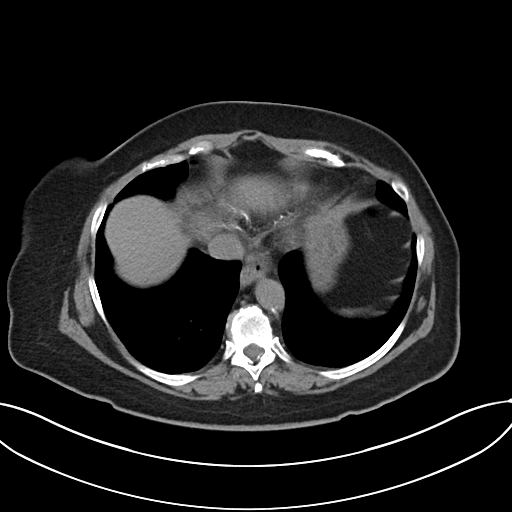

[Series 5: cor · coronal · 0.78mm/px · 3 of 89 slices shown]
[im 30/89  soft-tissue]
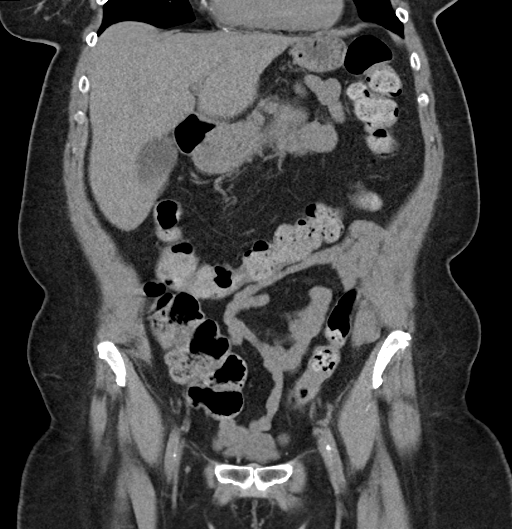
[im 40/89  soft-tissue]
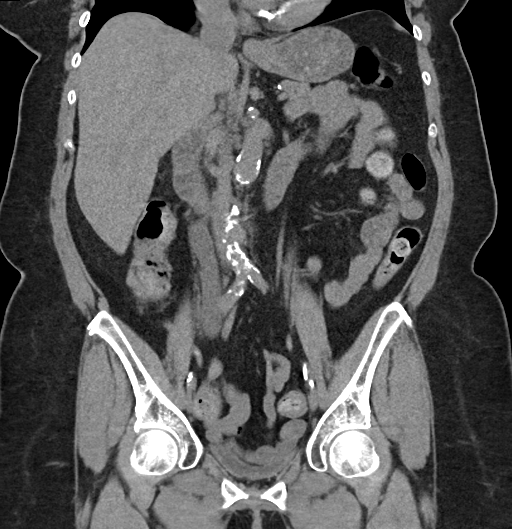
[im 49/89  soft-tissue]
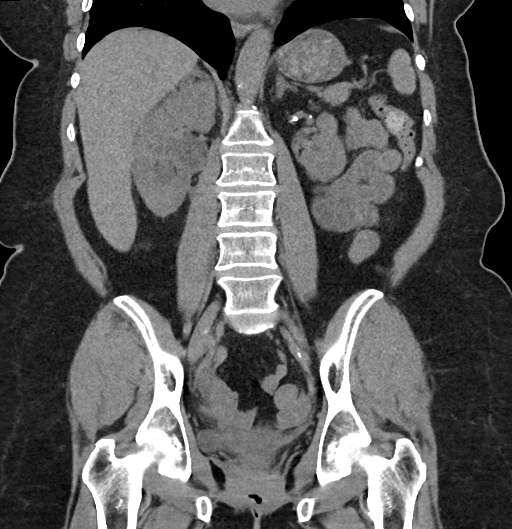

[16 of 46 positions shown; findings below may reference images not displayed]

FINDINGS: Lower chest: The visualized lung bases are clear bilaterally.
Extensive coronary artery calcification within the right coronary
artery. Extensive calcification of the mitral valve annulus. Global
cardiac size within normal limits.

Hepatobiliary: Tiny cyst within the right hepatic dome. Liver and
gallbladder are otherwise unremarkable. No intra or extrahepatic
biliary ductal dilation.

Pancreas: Unremarkable

Spleen: Unremarkable

Adrenals/Urinary Tract: The adrenal glands are unremarkable. There
is moderate asymmetric cortical atrophy of the left kidney. The
right kidney is normal in size and position. There is moderate right
hydronephrosis again identified with marked hydroureter to the level
of the distal right ureter where, similar to prior examination,
there is intraluminal hyperdensity. This could reflect calcific
debris within the distal right ureter, though its persistent since
remote prior examination raises the question of an underlying
stricture. Additional considerations such as dystrophic
calcification secondary to prior intervention or trauma, or
metaplastic calcification within a neoplasm are unusual. The bladder
is decompressed. No hydronephrosis is seen on the left.

Stomach/Bowel: Stomach, small bowel, and large bowel are
unremarkable. Appendix normal. No free intraperitoneal gas or fluid.

Vascular/Lymphatic: There is extensive aortoiliac atherosclerotic
calcification without evidence of aneurysm. Particularly prominent
calcification is seen at the origin of the mesenteric vessels,
though the degree of stenosis is not well assessed on this non
arteriographic examination. No pathologic adenopathy within the
abdomen and pelvis.

Reproductive: The uterus is absent. No adnexal masses. Radiopacities
seen within the pelvic floor posterior to the pubic symphysis may
relate to bladder suspension surgery.

Other: The rectum is unremarkable.

Musculoskeletal: Degenerative changes are seen within the lumbar
spine. No lytic or blastic bone lesions are seen.
IMPRESSION: 1. Persistent moderate right hydronephrosis and hydroureter to the
level of the distal right ureter, where there is intraluminal
hyperdensity. This could reflect calcific debris within the distal
right ureter, though its persistent since remote prior examination
raises the question of an underlying stricture. Additional
considerations such as dystrophic calcification secondary to prior
intervention or trauma, or metaplastic calcification within a
neoplasm are unusual.
2. Extensive aortoiliac atherosclerotic calcification. Particularly
prominent calcification is seen at the origin of the mesenteric
vessels, though the degree of stenosis is not well assessed on this
non arteriographic examination.

Aortic Atherosclerosis ([id number]).
